# Patient Record
Sex: Female | Born: 1990 | Hispanic: Yes | Marital: Married | Smoking: Never smoker
Health system: Southern US, Community
[De-identification: ages and names within clinical notes are randomized; demographics above are authoritative.]

## PROBLEM LIST (undated history)

## (undated) DIAGNOSIS — G8929 Other chronic pain: Secondary | ICD-10-CM

## (undated) DIAGNOSIS — O139 Gestational [pregnancy-induced] hypertension without significant proteinuria, unspecified trimester: Secondary | ICD-10-CM

## (undated) DIAGNOSIS — R102 Pelvic and perineal pain: Secondary | ICD-10-CM

## (undated) HISTORY — DX: Gestational (pregnancy-induced) hypertension without significant proteinuria, unspecified trimester: O13.9

## (undated) HISTORY — PX: TUMOR REMOVAL: SHX12

---
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
## 2011-05-08 ENCOUNTER — Emergency Department (HOSPITAL_COMMUNITY)
Admission: EM | Admit: 2011-05-08 | Discharge: 2011-05-09 | Disposition: A | Discharge status: Other Acute Inpatient Hospital | Payer: Medicaid Other | Attending: Emergency Medicine | Admitting: Emergency Medicine

## 2011-05-08 ENCOUNTER — Emergency Department (HOSPITAL_COMMUNITY): Payer: Medicaid Other

## 2011-05-08 DIAGNOSIS — W108XXA Fall (on) (from) other stairs and steps, initial encounter: Secondary | ICD-10-CM | POA: Insufficient documentation

## 2011-05-08 DIAGNOSIS — O99891 Other specified diseases and conditions complicating pregnancy: Secondary | ICD-10-CM | POA: Insufficient documentation

## 2011-05-08 DIAGNOSIS — R071 Chest pain on breathing: Secondary | ICD-10-CM | POA: Insufficient documentation

## 2011-05-08 DIAGNOSIS — R10819 Abdominal tenderness, unspecified site: Secondary | ICD-10-CM | POA: Insufficient documentation

## 2011-05-08 DIAGNOSIS — N949 Unspecified condition associated with female genital organs and menstrual cycle: Secondary | ICD-10-CM | POA: Insufficient documentation

## 2011-05-08 DIAGNOSIS — R1012 Left upper quadrant pain: Secondary | ICD-10-CM | POA: Insufficient documentation

## 2011-05-09 ENCOUNTER — Inpatient Hospital Stay (HOSPITAL_COMMUNITY)
Admission: AD | Admit: 2011-05-09 | Discharge: 2011-05-09 | Disposition: A | Discharge status: Home / Self Care | Payer: Medicaid Other | Source: Other Acute Inpatient Hospital | Attending: Obstetrics and Gynecology | Admitting: Obstetrics and Gynecology

## 2011-05-09 DIAGNOSIS — O99891 Other specified diseases and conditions complicating pregnancy: Secondary | ICD-10-CM | POA: Insufficient documentation

## 2011-05-09 DIAGNOSIS — O9989 Other specified diseases and conditions complicating pregnancy, childbirth and the puerperium: Secondary | ICD-10-CM

## 2011-05-09 DIAGNOSIS — W108XXA Fall (on) (from) other stairs and steps, initial encounter: Secondary | ICD-10-CM | POA: Insufficient documentation

## 2011-05-09 LAB — TYPE AND SCREEN: Antibody Screen: NEGATIVE

## 2011-05-09 LAB — URINALYSIS, ROUTINE W REFLEX MICROSCOPIC
Nitrite: NEGATIVE
Protein, ur: NEGATIVE mg/dL
Urobilinogen, UA: 0.2 mg/dL (ref 0.0–1.0)

## 2011-05-09 LAB — CBC
HCT: 33.2 % — ABNORMAL LOW (ref 36.0–46.0)
Hemoglobin: 11.9 g/dL — ABNORMAL LOW (ref 12.0–15.0)
MCH: 32.8 pg (ref 26.0–34.0)
MCHC: 35.8 g/dL (ref 30.0–36.0)
MCV: 91.5 fL (ref 78.0–100.0)
RBC: 3.63 MIL/uL — ABNORMAL LOW (ref 3.87–5.11)
RDW: 12.8 % (ref 11.5–15.5)
WBC: 9.5 10*3/uL (ref 4.0–10.5)

## 2011-05-09 LAB — DIFFERENTIAL
Basophils Absolute: 0 10*3/uL (ref 0.0–0.1)
Basophils Relative: 0 % (ref 0–1)
Eosinophils Absolute: 0.1 10*3/uL (ref 0.0–0.7)
Lymphs Abs: 2.2 10*3/uL (ref 0.7–4.0)
Monocytes Absolute: 1 10*3/uL (ref 0.1–1.0)
Monocytes Relative: 10 % (ref 3–12)
Neutro Abs: 6.2 10*3/uL (ref 1.7–7.7)
Neutrophils Relative %: 66 % (ref 43–77)

## 2011-05-09 LAB — COMPREHENSIVE METABOLIC PANEL
ALT: 55 U/L — ABNORMAL HIGH (ref 0–35)
Albumin: 3.2 g/dL — ABNORMAL LOW (ref 3.5–5.2)
Alkaline Phosphatase: 64 U/L (ref 39–117)
BUN: 9 mg/dL (ref 6–23)
CO2: 26 mEq/L (ref 19–32)
Calcium: 9.7 mg/dL (ref 8.4–10.5)
Chloride: 103 mEq/L (ref 96–112)
Creatinine, Ser: <0.47 mg/dL — ABNORMAL LOW (ref 0.50–1.10)
Glucose, Bld: 82 mg/dL (ref 70–99)
Potassium: 3.7 mEq/L (ref 3.5–5.1)
Sodium: 137 mEq/L (ref 135–145)
Total Bilirubin: 0.2 mg/dL — ABNORMAL LOW (ref 0.3–1.2)

## 2011-05-09 LAB — URINE MICROSCOPIC-ADD ON

## 2011-05-09 LAB — ABO/RH: ABO/RH(D): O POS

## 2012-07-10 ENCOUNTER — Encounter (HOSPITAL_COMMUNITY): Payer: Self-pay

## 2012-07-10 ENCOUNTER — Emergency Department (HOSPITAL_COMMUNITY)
Admission: EM | Admit: 2012-07-10 | Discharge: 2012-07-10 | Disposition: A | Discharge status: Home / Self Care | Payer: Medicaid Other | Attending: Emergency Medicine | Admitting: Emergency Medicine

## 2012-07-10 DIAGNOSIS — N390 Urinary tract infection, site not specified: Secondary | ICD-10-CM | POA: Insufficient documentation

## 2012-07-10 LAB — COMPREHENSIVE METABOLIC PANEL
AST: 17 U/L (ref 0–37)
BUN: 14 mg/dL (ref 6–23)
CO2: 25 mEq/L (ref 19–32)
Calcium: 9.6 mg/dL (ref 8.4–10.5)
Chloride: 104 mEq/L (ref 96–112)
Creatinine, Ser: 0.53 mg/dL (ref 0.50–1.10)
GFR calc non Af Amer: >90 mL/min (ref >90)
Total Bilirubin: 0.9 mg/dL (ref 0.3–1.2)

## 2012-07-10 LAB — URINALYSIS, ROUTINE W REFLEX MICROSCOPIC
Glucose, UA: NEGATIVE mg/dL
Hgb urine dipstick: NEGATIVE
Ketones, ur: NEGATIVE mg/dL
Protein, ur: NEGATIVE mg/dL
pH: 5.5 (ref 5.0–8.0)

## 2012-07-10 LAB — CBC WITH DIFFERENTIAL/PLATELET
Basophils Absolute: 0 10*3/uL (ref 0.0–0.1)
Basophils Relative: 0 % (ref 0–1)
Eosinophils Relative: 1 % (ref 0–5)
HCT: 39.5 % (ref 36.0–46.0)
Hemoglobin: 13.4 g/dL (ref 12.0–15.0)
Lymphocytes Relative: 31 % (ref 12–46)
MCHC: 33.9 g/dL (ref 30.0–36.0)
MCV: 90.4 fL (ref 78.0–100.0)
Monocytes Absolute: 0.5 10*3/uL (ref 0.1–1.0)
Monocytes Relative: 7 % (ref 3–12)
RDW: 12.4 % (ref 11.5–15.5)

## 2012-07-10 LAB — URINE MICROSCOPIC-ADD ON

## 2012-07-10 LAB — POCT PREGNANCY, URINE: Preg Test, Ur: NEGATIVE

## 2012-07-10 MED ORDER — NITROFURANTOIN MONOHYD MACRO 100 MG PO CAPS: 100.0000 mg | ORAL_CAPSULE | Freq: Two times a day (BID) | ORAL | Status: AC

## 2012-07-10 NOTE — ED Notes (Signed)
Pt here with abdominal pain, started yesterday.

## 2012-07-10 NOTE — ED Provider Notes (Signed)
History     CSN: [id number]  Arrival date & time 07/10/12  1254   First MD Initiated Contact with Patient 07/10/12 1643      Chief Complaint  Patient presents with  . Abdominal Pain    (Consider location/radiation/quality/duration/timing/severity/associated sxs/prior treatment) HPI Comments: Patient presents with a chief complaint of dysuria and suprapubic abdominal pain.  She describes the pain as a pressure.  Pain does not radiate.  Symptoms have been present since yesterday.  Symptoms gradually worsening.  She denies any nausea or vomiting.  No flank pain.  No diarrhea.  No fever or chills.    Patient is a 21 y.o. female presenting with dysuria. The history is provided by the patient.  Dysuria  This is a new problem. The current episode started yesterday. The problem occurs every urination. The problem has been gradually worsening. The quality of the pain is described as burning. There has been no fever. She is sexually active. There is no history of pyelonephritis. Associated symptoms include frequency, hesitancy and urgency. Pertinent negatives include no chills, no nausea, no vomiting, no discharge, no hematuria, no possible pregnancy and no flank pain. She has tried nothing for the symptoms. Her past medical history does not include recurrent UTIs.    History reviewed. No pertinent past medical history.  No past surgical history on file.  No family history on file.  History  Substance Use Topics  . Smoking status: Never Smoker   . Smokeless tobacco: Not on file  . Alcohol Use: No    OB History    Grav Para Term Preterm Abortions TAB SAB Ect Mult Living                  Review of Systems  Constitutional: Negative for fever and chills.  Gastrointestinal: Positive for abdominal pain. Negative for nausea, vomiting, diarrhea and constipation.  Genitourinary: Positive for dysuria, hesitancy, urgency and frequency. Negative for hematuria, flank pain, decreased urine volume,  vaginal bleeding, vaginal discharge and difficulty urinating.    Allergies  Review of patient's allergies indicates no known allergies.  Home Medications  No current outpatient prescriptions on file.  BP 98/59  Pulse 88  Temp 98 F (36.7 C) (Oral)  Resp 18  Ht 5\' 2"  (1.575 m)  Wt 115 lb (52.164 kg)  BMI 21.03 kg/m2  SpO2 100%  Physical Exam  Nursing note and vitals reviewed. Constitutional: She appears well-developed and well-nourished. No distress.  HENT:  Head: Normocephalic and atraumatic.  Mouth/Throat: Oropharynx is clear and moist.  Cardiovascular: Normal rate, regular rhythm and normal heart sounds.   Pulmonary/Chest: Effort normal and breath sounds normal.  Abdominal: Soft. Normal appearance and bowel sounds are normal. She exhibits no mass. There is tenderness in the suprapubic area. There is no rigidity, no rebound, no guarding and no CVA tenderness.  Neurological: She is alert.  Skin: Skin is warm and dry. She is not diaphoretic.  Psychiatric: She has a normal mood and affect.    ED Course  Procedures (including critical care time)  Labs Reviewed  URINALYSIS, ROUTINE W REFLEX MICROSCOPIC - Abnormal; Notable for the following:    APPearance HAZY (*)     Bilirubin Urine SMALL (*)     Leukocytes, UA SMALL (*)     All other components within normal limits  URINE MICROSCOPIC-ADD ON - Abnormal; Notable for the following:    Squamous Epithelial / LPF MANY (*)     Bacteria, UA MANY (*)  All other components within normal limits  CBC WITH DIFFERENTIAL  COMPREHENSIVE METABOLIC PANEL  POCT PREGNANCY, URINE  URINE CULTURE   No results found.   No diagnosis found.    MDM  Patient presenting with dysuria and suprapubic abdominal pressure.  UA shows a UTI.  Urine pregnancy negative.  Remainder of the labs are unremarkable.  Patient is afebrile.  No CVA tenderness.  No nausea or vomiting.  Therefore, feel that patient can be discharged home with antibiotic  prescription.  Return precautions discussed with patient.        Pascal Lux Bradford, PA-C 07/10/12 1705

## 2012-07-10 NOTE — ED Notes (Signed)
Pt ambulated to bathroom and provided an urine specimen

## 2012-07-11 LAB — URINE CULTURE
Colony Count: NO GROWTH
Culture: NO GROWTH

## 2012-07-11 NOTE — ED Provider Notes (Signed)
Medical screening examination/treatment/procedure(s) were performed by non-physician practitioner and as supervising physician I was immediately available for consultation/collaboration.  Jones Skene, M.D.     Jones Skene, MD 07/11/12 1232

## 2014-09-18 ENCOUNTER — Other Ambulatory Visit (HOSPITAL_COMMUNITY): Payer: Self-pay | Admitting: Family Medicine

## 2014-09-18 DIAGNOSIS — N939 Abnormal uterine and vaginal bleeding, unspecified: Secondary | ICD-10-CM

## 2014-09-21 ENCOUNTER — Other Ambulatory Visit (HOSPITAL_COMMUNITY): Payer: Self-pay | Admitting: Family Medicine

## 2014-09-21 DIAGNOSIS — N939 Abnormal uterine and vaginal bleeding, unspecified: Secondary | ICD-10-CM

## 2014-09-23 ENCOUNTER — Ambulatory Visit (HOSPITAL_COMMUNITY)
Admission: RE | Admit: 2014-09-23 | Discharge: 2014-09-23 | Disposition: A | Discharge status: Home / Self Care | Payer: Self-pay | Source: Ambulatory Visit | Attending: Family Medicine | Admitting: Family Medicine

## 2014-09-23 ENCOUNTER — Ambulatory Visit (HOSPITAL_COMMUNITY): Payer: Medicaid Other

## 2014-09-23 DIAGNOSIS — R938 Abnormal findings on diagnostic imaging of other specified body structures: Secondary | ICD-10-CM | POA: Insufficient documentation

## 2014-09-23 DIAGNOSIS — R102 Pelvic and perineal pain: Secondary | ICD-10-CM | POA: Insufficient documentation

## 2014-09-23 DIAGNOSIS — N939 Abnormal uterine and vaginal bleeding, unspecified: Secondary | ICD-10-CM | POA: Insufficient documentation

## 2015-04-03 ENCOUNTER — Encounter (HOSPITAL_COMMUNITY): Payer: Self-pay | Admitting: Emergency Medicine

## 2015-04-03 ENCOUNTER — Emergency Department (HOSPITAL_COMMUNITY)
Admission: EM | Admit: 2015-04-03 | Discharge: 2015-04-04 | Disposition: A | Discharge status: Home / Self Care | Payer: Self-pay | Attending: Emergency Medicine | Admitting: Emergency Medicine

## 2015-04-03 DIAGNOSIS — R110 Nausea: Secondary | ICD-10-CM | POA: Insufficient documentation

## 2015-04-03 DIAGNOSIS — R509 Fever, unspecified: Secondary | ICD-10-CM | POA: Insufficient documentation

## 2015-04-03 DIAGNOSIS — M549 Dorsalgia, unspecified: Secondary | ICD-10-CM | POA: Insufficient documentation

## 2015-04-03 DIAGNOSIS — R102 Pelvic and perineal pain: Secondary | ICD-10-CM

## 2015-04-03 DIAGNOSIS — R350 Frequency of micturition: Secondary | ICD-10-CM | POA: Insufficient documentation

## 2015-04-03 DIAGNOSIS — R319 Hematuria, unspecified: Secondary | ICD-10-CM | POA: Insufficient documentation

## 2015-04-03 DIAGNOSIS — Z87442 Personal history of urinary calculi: Secondary | ICD-10-CM | POA: Insufficient documentation

## 2015-04-03 DIAGNOSIS — R1030 Lower abdominal pain, unspecified: Secondary | ICD-10-CM | POA: Insufficient documentation

## 2015-04-03 NOTE — ED Provider Notes (Addendum)
CSN: [id number]     Arrival date & time 04/03/15  2311 History  This chart was scribed for Derwood Kaplan, MD by Phillis Haggis, ED Scribe. This patient was seen in room A03C/A03C and patient care was started at 11:57 PM.   Chief Complaint  Patient presents with  . Vaginal Bleeding  . Abdominal Cramping   The history is provided by the patient. No language interpreter was used.  HPI Comments: Michele Bates is a 23 y.o. female who presents to the Emergency Department complaining of intermittent lower abdominal pain onset two months ago. She reports each episode prior to Monday once a week. She reports that she could not sleep due to pain on Monday and has been experiencing vaginal spotting. She states that it feels like a sharp, burning pulling pain with each episode lasting 10-15 seconds, with multiple episodes per day. She states that she has been very irregular in her period, but has never experienced spotting. She reports her last menstrual period was last week. She reports clear, egg white thick discharge. She reports associated hematuria, frequency and lower back pain that is worse on the right side. She reports associated fever and chills two weeks ago that lasted one day. She reports history of UTIs. She denies history of STDs. She reports one pregnancy without complications. She denies history of abdominal surgeries. She denies history of pelvic disorders.  History reviewed. No pertinent past medical history. History reviewed. No pertinent past surgical history. No family history on file. History  Substance Use Topics  . Smoking status: Never Smoker   . Smokeless tobacco: Not on file  . Alcohol Use: No   OB History    No data available     Review of Systems  Constitutional: Positive for fever and chills.  Gastrointestinal: Positive for nausea and abdominal pain.  Genitourinary: Positive for frequency, hematuria, vaginal bleeding and vaginal discharge.  Musculoskeletal:  Positive for back pain.  All other systems reviewed and are negative.  Allergies  Review of patient's allergies indicates no known allergies.  Home Medications   Prior to Admission medications   Medication Sig Start Date End Date Taking? Authorizing Provider  ibuprofen (ADVIL,MOTRIN) 600 MG tablet Take 1 tablet (600 mg total) by mouth every 6 (six) hours as needed. 04/04/15   Genesia Caslin, MD   BP 108/67 mmHg  Pulse 77  Temp(Src) 98.4 F (36.9 C) (Oral)  Resp 20  SpO2 99%  LMP 03/27/2015   Physical Exam  Constitutional: She is oriented to person, place, and time. She appears well-developed and well-nourished.  HENT:  Head: Normocephalic and atraumatic.  Eyes: Conjunctivae and EOM are normal. Pupils are equal, round, and reactive to light.  Neck: Normal range of motion. Neck supple.  Cardiovascular: Normal rate, regular rhythm, normal heart sounds and intact distal pulses.   No murmur heard. Pulmonary/Chest: Effort normal and breath sounds normal. No respiratory distress. She has no wheezes.  Abdominal: Soft. Bowel sounds are normal. She exhibits no distension. There is tenderness in the right lower quadrant and suprapubic area. There is no rebound and no guarding.  Genitourinary: Vagina normal and uterus normal.  External exam - normal, no lesions Speculum exam: Pt has some white discharge, no blood Bimanual exam: Patient has no CMT, no adnexal tenderness or fullness and cervical os is closed  Musculoskeletal: Normal range of motion.  Right flank tenderness  Neurological: She is alert and oriented to person, place, and time.  Skin: Skin is warm and dry.  Psychiatric: She has a normal mood and affect. Her behavior is normal.  Nursing note and vitals reviewed.   ED Course  Procedures (including critical care time) DIAGNOSTIC STUDIES: Oxygen Saturation is 99% on room air, normal by my interpretation.    COORDINATION OF CARE: 12:05 AM-Discussed treatment plan which  includes labs and pelvic exam with pt at bedside and pt agreed to plan.   Labs Review Labs Reviewed  CBC WITH DIFFERENTIAL/PLATELET - Abnormal; Notable for the following:    Lymphocytes Relative 47 (*)    All other components within normal limits  LIPASE, BLOOD - Abnormal; Notable for the following:    Lipase 16 (*)    All other components within normal limits  URINALYSIS, ROUTINE W REFLEX MICROSCOPIC (NOT AT [facility]) - Abnormal; Notable for the following:    Hgb urine dipstick SMALL (*)    All other components within normal limits  WET PREP, GENITAL  COMPREHENSIVE METABOLIC PANEL  PREGNANCY, URINE  URINE MICROSCOPIC-ADD ON  POC URINE PREG, ED  GC/CHLAMYDIA PROBE AMP ([place]) NOT AT [facility]   Imaging Review US Transvaginal Non-ob  04/04/2015   CLINICAL DATA:  Chronic suprapubic abdominal pain and hematuria. Back pain. Vaginal bleeding. Initial encounter.  EXAM: TRANSABDOMINAL AND TRANSVAGINAL ULTRASOUND OF PELVIS  TECHNIQUE: Both transabdominal and transvaginal ultrasound examinations of the pelvis were performed. Transabdominal technique was performed for global imaging of the pelvis including uterus, ovaries, adnexal regions, and pelvic cul-de-sac. It was necessary to proceed with endovaginal exam following the transabdominal exam to visualize the endometrium.  COMPARISON:  None  FINDINGS: Uterus  Measurements: 8.2 x 3.4 x 4.3 cm. No fibroids or other mass visualized.  Endometrium  Thickness: 0.4 cm.  No focal abnormality visualized.  Right ovary  Measurements: 2.3 x 2.1 x 1.2 cm. Normal appearance/no adnexal mass.  Left ovary  Measurements: 2.7 x 1.7 x 2.1 cm. Normal appearance/no adnexal mass.  Other findings  No free fluid is seen within the pelvic cul-de-sac.  IMPRESSION: Unremarkable pelvic ultrasound.  No evidence for ovarian torsion.   Electronically Signed   By: Roanna Raider M.D.   On: 04/04/2015 02:04   US Pelvis Complete  04/04/2015   CLINICAL DATA:  Chronic suprapubic abdominal  pain and hematuria. Back pain. Vaginal bleeding. Initial encounter.  EXAM: TRANSABDOMINAL AND TRANSVAGINAL ULTRASOUND OF PELVIS  TECHNIQUE: Both transabdominal and transvaginal ultrasound examinations of the pelvis were performed. Transabdominal technique was performed for global imaging of the pelvis including uterus, ovaries, adnexal regions, and pelvic cul-de-sac. It was necessary to proceed with endovaginal exam following the transabdominal exam to visualize the endometrium.  COMPARISON:  None  FINDINGS: Uterus  Measurements: 8.2 x 3.4 x 4.3 cm. No fibroids or other mass visualized.  Endometrium  Thickness: 0.4 cm.  No focal abnormality visualized.  Right ovary  Measurements: 2.3 x 2.1 x 1.2 cm. Normal appearance/no adnexal mass.  Left ovary  Measurements: 2.7 x 1.7 x 2.1 cm. Normal appearance/no adnexal mass.  Other findings  No free fluid is seen within the pelvic cul-de-sac.  IMPRESSION: Unremarkable pelvic ultrasound.  No evidence for ovarian torsion.   Electronically Signed   By: Roanna Raider M.D.   On: 04/04/2015 02:04     EKG Interpretation None      MDM   Final diagnoses:  Suprapubic pain, acute    I personally performed the services described in this documentation, which was scribed in my presence. The recorded information has been reviewed and is accurate.  Pt comes  in with lower quadrant abd pain - suprapubic region. Intermittent initially, now constant. Pelvic exam unremarkable. No concerns for etiologies that cause acute abdomen, and no clinical concerns for infectious process. Will get US pelvis - if neg, will give her gyne f/u.    Derwood KaplanAnkit Rally Ouch, MD 04/04/15 [id number]  Derwood KaplanAnkit Helayna Dun, MD 04/04/15 [phone]

## 2015-04-03 NOTE — ED Notes (Signed)
Pt. reports vaginal spotting with nausea and low abdominal cramping onset yesterday , denies  fever or chills, pt. also added intermittent diarrhea this week .

## 2015-04-04 ENCOUNTER — Emergency Department (HOSPITAL_COMMUNITY): Payer: Self-pay

## 2015-04-04 LAB — URINALYSIS, ROUTINE W REFLEX MICROSCOPIC
Bilirubin Urine: NEGATIVE
Glucose, UA: NEGATIVE mg/dL
Ketones, ur: NEGATIVE mg/dL
Leukocytes, UA: NEGATIVE
NITRITE: NEGATIVE
PH: 6 (ref 5.0–8.0)
Protein, ur: NEGATIVE mg/dL
Specific Gravity, Urine: 1.026 (ref 1.005–1.030)
Urobilinogen, UA: 0.2 mg/dL (ref 0.0–1.0)

## 2015-04-04 LAB — CBC WITH DIFFERENTIAL/PLATELET
BASOS ABS: 0 10*3/uL (ref 0.0–0.1)
BASOS PCT: 0 % (ref 0–1)
Eosinophils Absolute: 0.2 10*3/uL (ref 0.0–0.7)
Eosinophils Relative: 3 % (ref 0–5)
HEMATOCRIT: 38.4 % (ref 36.0–46.0)
Hemoglobin: 12.9 g/dL (ref 12.0–15.0)
LYMPHS PCT: 47 % — AB (ref 12–46)
Lymphs Abs: 3.3 10*3/uL (ref 0.7–4.0)
MCH: 29.6 pg (ref 26.0–34.0)
MCHC: 33.6 g/dL (ref 30.0–36.0)
MCV: 88.1 fL (ref 78.0–100.0)
MONO ABS: 0.5 10*3/uL (ref 0.1–1.0)
MONOS PCT: 6 % (ref 3–12)
Neutro Abs: 3.2 10*3/uL (ref 1.7–7.7)
Neutrophils Relative %: 44 % (ref 43–77)
PLATELETS: 297 10*3/uL (ref 150–400)
RBC: 4.36 MIL/uL (ref 3.87–5.11)
RDW: 12.7 % (ref 11.5–15.5)
WBC: 7.2 10*3/uL (ref 4.0–10.5)

## 2015-04-04 LAB — URINE MICROSCOPIC-ADD ON

## 2015-04-04 LAB — COMPREHENSIVE METABOLIC PANEL
ALBUMIN: 4 g/dL (ref 3.5–5.0)
ALT: 30 U/L (ref 14–54)
AST: 26 U/L (ref 15–41)
Alkaline Phosphatase: 63 U/L (ref 38–126)
Anion gap: 7 (ref 5–15)
BUN: 13 mg/dL (ref 6–20)
CO2: 25 mmol/L (ref 22–32)
Calcium: 9.3 mg/dL (ref 8.9–10.3)
Chloride: 106 mmol/L (ref 101–111)
Creatinine, Ser: 0.72 mg/dL (ref 0.44–1.00)
GFR calc Af Amer: >60 mL/min (ref >60)
GLUCOSE: 99 mg/dL (ref 65–99)
POTASSIUM: 4.1 mmol/L (ref 3.5–5.1)
Sodium: 138 mmol/L (ref 135–145)
Total Bilirubin: 0.4 mg/dL (ref 0.3–1.2)
Total Protein: 7 g/dL (ref 6.5–8.1)

## 2015-04-04 LAB — WET PREP, GENITAL
CLUE CELLS WET PREP: NONE SEEN
Trich, Wet Prep: NONE SEEN
WBC WET PREP: NONE SEEN
Yeast Wet Prep HPF POC: NONE SEEN

## 2015-04-04 LAB — LIPASE, BLOOD: Lipase: 16 U/L — ABNORMAL LOW (ref 22–51)

## 2015-04-04 LAB — POC URINE PREG, ED: Preg Test, Ur: NEGATIVE

## 2015-04-04 LAB — PREGNANCY, URINE: Preg Test, Ur: NEGATIVE

## 2015-04-04 MED ORDER — IBUPROFEN 600 MG PO TABS: 600.0000 mg | ORAL_TABLET | Freq: Four times a day (QID) | ORAL | Status: DC | PRN

## 2015-04-04 NOTE — Discharge Instructions (Signed)
We saw you in the ER for THE LOWER ABDOMINAL PAIN All the results in the ER are normal, labs and imaging. We are not sure what is causing your symptoms. The workup in the ER is not complete, and is limited to screening for life threatening and emergent conditions only, so please see a primary care doctor for further evaluation as requested.  Pelvic Pain Female pelvic pain can be caused by many different things and start from a variety of places. Pelvic pain refers to pain that is located in the lower half of the abdomen and between your hips. The pain may occur over a short period of time (acute) or may be reoccurring (chronic). The cause of pelvic pain may be related to disorders affecting the female reproductive organs (gynecologic), but it may also be related to the bladder, kidney stones, an intestinal complication, or muscle or skeletal problems. Getting help right away for pelvic pain is important, especially if there has been severe, sharp, or a sudden onset of unusual pain. It is also important to get help right away because some types of pelvic pain can be life threatening.  CAUSES  Below are only some of the causes of pelvic pain. The causes of pelvic pain can be in one of several categories.   Gynecologic.  Pelvic inflammatory disease.  Sexually transmitted infection.  Ovarian cyst or a twisted ovarian ligament (ovarian torsion).  Uterine lining that grows outside the uterus (endometriosis).  Fibroids, cysts, or tumors.  Ovulation.  Pregnancy.  Pregnancy that occurs outside the uterus (ectopic pregnancy).  Miscarriage.  Labor.  Abruption of the placenta or ruptured uterus.  Infection.  Uterine infection (endometritis).  Bladder infection.  Diverticulitis.  Miscarriage related to a uterine infection (septic abortion).  Bladder.  Inflammation of the bladder (cystitis).  Kidney  stone(s).  Gastrointestinal.  Constipation.  Diverticulitis.  Neurologic.  Trauma.  Feeling pelvic pain because of mental or emotional causes (psychosomatic).  Cancers of the bowel or pelvis. EVALUATION  Your caregiver will want to take a careful history of your concerns. This includes recent changes in your health, a careful gynecologic history of your periods (menses), and a sexual history. Obtaining your family history and medical history is also important. Your caregiver may suggest a pelvic exam. A pelvic exam will help identify the location and severity of the pain. It also helps in the evaluation of which organ system may be involved. In order to identify the cause of the pelvic pain and be properly treated, your caregiver may order tests. These tests may include:   A pregnancy test.  Pelvic ultrasonography.  An X-ray exam of the abdomen.  A urinalysis or evaluation of vaginal discharge.  Blood tests. HOME CARE INSTRUCTIONS   Only take over-the-counter or prescription medicines for pain, discomfort, or fever as directed by your caregiver.   Rest as directed by your caregiver.   Eat a balanced diet.   Drink enough fluids to make your urine clear or pale yellow, or as directed.   Avoid sexual intercourse if it causes pain.   Apply warm or cold compresses to the lower abdomen depending on which one helps the pain.   Avoid stressful situations.   Keep a journal of your pelvic pain. Write down when it started, where the pain is located, and if there are things that seem to be associated with the pain, such as food or your menstrual cycle.  Follow up with your caregiver as directed.  SEEK MEDICAL CARE IF:  Your medicine does not help your pain.  You have abnormal vaginal discharge. SEEK IMMEDIATE MEDICAL CARE IF:   You have heavy bleeding from the vagina.   Your pelvic pain increases.   You feel light-headed or faint.   You have chills.   You  have pain with urination or blood in your urine.   You have uncontrolled diarrhea or vomiting.   You have a fever or persistent symptoms for more than 3 days.  You have a fever and your symptoms suddenly get worse.   You are being physically or sexually abused.  MAKE SURE YOU:  Understand these instructions.  Will watch your condition.  Will get help if you are not doing well or get worse. Document Released: 09/19/2004 Document Revised: 03/09/2014 Document Reviewed: 02/12/2012 [facility] Patient Information 2015 [place], [place]. This information is not intended to replace advice given to you by your health care provider. Make sure you discuss any questions you have with your health care provider.

## 2015-04-06 LAB — GC/CHLAMYDIA PROBE AMP (~~LOC~~) NOT AT ARMC
Chlamydia: NEGATIVE
Neisseria Gonorrhea: NEGATIVE

## 2015-06-08 ENCOUNTER — Encounter (HOSPITAL_COMMUNITY): Payer: Self-pay | Admitting: Cardiology

## 2015-06-08 ENCOUNTER — Emergency Department (HOSPITAL_COMMUNITY)
Admission: EM | Admit: 2015-06-08 | Discharge: 2015-06-08 | Disposition: A | Discharge status: Home / Self Care | Payer: Self-pay | Attending: Emergency Medicine | Admitting: Emergency Medicine

## 2015-06-08 ENCOUNTER — Emergency Department (HOSPITAL_COMMUNITY): Payer: Self-pay

## 2015-06-08 DIAGNOSIS — N939 Abnormal uterine and vaginal bleeding, unspecified: Secondary | ICD-10-CM

## 2015-06-08 DIAGNOSIS — Z79899 Other long term (current) drug therapy: Secondary | ICD-10-CM | POA: Insufficient documentation

## 2015-06-08 DIAGNOSIS — R102 Pelvic and perineal pain unspecified side: Secondary | ICD-10-CM

## 2015-06-08 DIAGNOSIS — R52 Pain, unspecified: Secondary | ICD-10-CM

## 2015-06-08 DIAGNOSIS — G8929 Other chronic pain: Secondary | ICD-10-CM | POA: Insufficient documentation

## 2015-06-08 DIAGNOSIS — Z3202 Encounter for pregnancy test, result negative: Secondary | ICD-10-CM | POA: Insufficient documentation

## 2015-06-08 HISTORY — DX: Other chronic pain: G89.29

## 2015-06-08 HISTORY — DX: Pelvic and perineal pain: R10.2

## 2015-06-08 LAB — POC URINE PREG, ED: Preg Test, Ur: NEGATIVE

## 2015-06-08 LAB — URINALYSIS, ROUTINE W REFLEX MICROSCOPIC
Bilirubin Urine: NEGATIVE
GLUCOSE, UA: NEGATIVE mg/dL
Ketones, ur: NEGATIVE mg/dL
LEUKOCYTES UA: NEGATIVE
NITRITE: NEGATIVE
Protein, ur: NEGATIVE mg/dL
Specific Gravity, Urine: 1.01 (ref 1.005–1.030)
Urobilinogen, UA: 0.2 mg/dL (ref 0.0–1.0)
pH: 6.5 (ref 5.0–8.0)

## 2015-06-08 LAB — WET PREP, GENITAL
CLUE CELLS WET PREP: NONE SEEN
TRICH WET PREP: NONE SEEN
YEAST WET PREP: NONE SEEN

## 2015-06-08 LAB — URINE MICROSCOPIC-ADD ON

## 2015-06-08 MED ORDER — NAPROXEN 250 MG PO TABS: 250.0000 mg | ORAL_TABLET | Freq: Two times a day (BID) | ORAL | Status: DC | PRN

## 2015-06-08 MED ORDER — KETOROLAC TROMETHAMINE 60 MG/2ML IM SOLN
60.0000 mg | Freq: Once | INTRAMUSCULAR | Status: AC
  Administered 2015-06-08: 60 mg via INTRAMUSCULAR
  Filled 2015-06-08: qty 2

## 2015-06-08 NOTE — ED Notes (Signed)
Lower abdominal pain since yesterday.  Pain is worse today.

## 2015-06-08 NOTE — ED Notes (Signed)
Patient with no complaints at this time. Respirations even and unlabored. Skin warm/dry. Discharge instructions reviewed with patient at this time. Patient given opportunity to voice concerns/ask questions. Patient discharged at this time and left Emergency Department with steady gait.   

## 2015-06-08 NOTE — ED Provider Notes (Signed)
CSN: [id number]     Arrival date & time 06/08/15  1301 History   First MD Initiated Contact with Patient 06/08/15 1313     Chief Complaint  Patient presents with  . Pelvic Pain      HPI Pt was seen at 1325.  Per pt, c/o gradual onset and persistence of constant acute flair of her chronic suprapubic pelvic "pain" since yesterday.  Has been associated with nausea and vaginal bleeding.  Describes the abd pain as "sharp" and "cramping." States she "missed her period" last month. Pt has not taken any medication to treat her symptoms. Denies vomiting/diarrhea, no dysuria, no fevers, no back pain, no rash, no CP/SOB, no black or blood in stools. The symptoms have been associated with no other complaints. The patient has a significant history of similar symptoms previously, recently being evaluated for this complaint and multiple prior evals for same. Pt has not f/u with OB/GYN MD.     Past Medical History  Diagnosis Date  . Chronic pelvic pain in female    History reviewed. No pertinent past surgical history.  History  Substance Use Topics  . Smoking status: Never Smoker   . Smokeless tobacco: Not on file  . Alcohol Use: No    Review of Systems ROS: Statement: All systems negative except as marked or noted in the HPI; Constitutional: Negative for fever and chills. ; ; Eyes: Negative for eye pain, redness and discharge. ; ; ENMT: Negative for ear pain, hoarseness, nasal congestion, sinus pressure and sore throat. ; ; Cardiovascular: Negative for chest pain, palpitations, diaphoresis, dyspnea and peripheral edema. ; ; Respiratory: Negative for cough, wheezing and stridor. ; ; Gastrointestinal: +nausea. Negative for vomiting, diarrhea, abdominal pain, blood in stool, hematemesis, jaundice and rectal bleeding. . ; ; Genitourinary: Negative for dysuria, flank pain and hematuria. ; ; GYN:  +pelvic pain, +vaginal bleeding, no vaginal discharge, no vulvar pain. ;; Musculoskeletal: Negative for back pain  and neck pain. Negative for swelling and trauma.; ; Skin: Negative for pruritus, rash, abrasions, blisters, bruising and skin lesion.; ; Neuro: Negative for headache, lightheadedness and neck stiffness. Negative for weakness, altered level of consciousness , altered mental status, extremity weakness, paresthesias, involuntary movement, seizure and syncope.      Allergies  Review of patient's allergies indicates no known allergies.  Home Medications   Prior to Admission medications   Medication Sig Start Date End Date Taking? Authorizing Provider  OVER THE COUNTER MEDICATION Take 1 packet by mouth daily. Over the counter medication for fertility. (premama)   Yes Historical Provider, MD  ibuprofen (ADVIL,MOTRIN) 600 MG tablet Take 1 tablet (600 mg total) by mouth every 6 (six) hours as needed. Patient not taking: Reported on 06/08/2015 04/04/15   Derwood Kaplan, MD   BP 119/72 mmHg  Pulse 84  Temp(Src) 98.7 F (37.1 C) (Oral)  Resp 20  Wt 120 lb (54.432 kg)  SpO2 100%  LMP 06/07/2015 Physical Exam  1330: Physical examination:  Nursing notes reviewed; Vital signs and O2 SAT reviewed;  Constitutional: Well developed, Well nourished, Well hydrated, In no acute distress; Head:  Normocephalic, atraumatic; Eyes: EOMI, PERRL, No scleral icterus; ENMT: Mouth and pharynx normal, Mucous membranes moist; Neck: Supple, Full range of motion, No lymphadenopathy; Cardiovascular: Regular rate and rhythm, No murmur, rub, or gallop; Respiratory: Breath sounds clear & equal bilaterally, No rales, rhonchi, wheezes.  Speaking full sentences with ease, Normal respiratory effort/excursion; Chest: Nontender, Movement normal; Abdomen: Soft, +suprapubic tenderness to palp. No rebound  or guarding. Nondistended, Normal bowel sounds; Genitourinary: No CVA tenderness. Pelvic exam performed with permission of pt and female ED RN assist during exam.  External genitalia w/o lesions. Vaginal vault without discharge, small amount  of dark blood.  Cervix w/o lesions, not friable, GC/chlam and wet prep obtained and sent to lab.  Bimanual exam w/o CMT or left adnexal tenderness; +uterine and right adnexal tenderness.;; Extremities: Pulses normal, No tenderness, No edema, No calf edema or asymmetry.; Neuro: AA&Ox3, Major CN grossly intact.  Speech clear. No gross focal motor or sensory deficits in extremities. Climbs on and off stretcher easily by herself. Gait steady.; Skin: Color normal, Warm, Dry.   ED Course  Procedures     EKG Interpretation None      MDM  MDM Reviewed: previous chart, nursing note and vitals Reviewed previous: ultrasound and labs Interpretation: labs and ultrasound      Results for orders placed or performed during the hospital encounter of 06/08/15  Wet prep, genital  Result Value Ref Range   Yeast Wet Prep HPF POC NONE SEEN NONE SEEN   Trich, Wet Prep NONE SEEN NONE SEEN   Clue Cells Wet Prep HPF POC NONE SEEN NONE SEEN   WBC, Wet Prep HPF POC FEW (A) NONE SEEN  Urinalysis, Routine w reflex microscopic (not at [facility])  Result Value Ref Range   Color, Urine STRAW (A) YELLOW   APPearance CLOUDY (A) CLEAR   Specific Gravity, Urine 1.010 1.005 - 1.030   pH 6.5 5.0 - 8.0   Glucose, UA NEGATIVE NEGATIVE mg/dL   Hgb urine dipstick LARGE (A) NEGATIVE   Bilirubin Urine NEGATIVE NEGATIVE   Ketones, ur NEGATIVE NEGATIVE mg/dL   Protein, ur NEGATIVE NEGATIVE mg/dL   Urobilinogen, UA 0.2 0.0 - 1.0 mg/dL   Nitrite NEGATIVE NEGATIVE   Leukocytes, UA NEGATIVE NEGATIVE  Urine microscopic-add on  Result Value Ref Range   Squamous Epithelial / LPF RARE RARE   WBC, UA 0-2 <3 WBC/hpf   RBC / HPF TOO NUMEROUS TO COUNT <3 RBC/hpf  POC urine preg, ED (not at [facility])  Result Value Ref Range   Preg Test, Ur NEGATIVE NEGATIVE   US Transvaginal Non-ob 06/08/2015   CLINICAL DATA:  Right-sided pelvic pain and dysmenorrhea  EXAM: TRANSABDOMINAL AND TRANSVAGINAL ULTRASOUND OF PELVIS  DOPPLER ULTRASOUND OF  OVARIES  TECHNIQUE: Study was performed transabdominally to optimize pelvic field of view evaluation and transvaginally to optimize internal visceral architecture evaluation.  Color and duplex Doppler ultrasound was utilized to evaluate blood flow to the ovaries.  COMPARISON:  Apr 04, 2015  FINDINGS: Uterus  Measurements: 9.6 x 4.1 x 5.2 cm. No fibroids or other mass visualized.  Endometrium  Thickness: 14 mm.  No focal abnormality visualized.  Right ovary  Measurements: 2.9 x 1.7 x 2.1 cm. Normal appearance/no adnexal mass.  Left ovary  Measurements: 2.8 x 1.2 x 1.9 cm. Normal appearance/no adnexal mass.  Pulsed Doppler evaluation of both ovaries demonstrates normal low-resistance arterial and venous waveforms. The peak systolic velocity in the right ovary is 9 cm/sec. The peak systolic velocity in the left ovary is 11 cm/sec.  Other findings  No free fluid.  IMPRESSION: No abnormality noted. No intrauterine or extrauterine pelvic mass. No evidence of torsion. No free fluid.   Electronically Signed   By: Bretta Bang III M.D.   On: 06/08/2015 15:28   Ct Renal Stone Study 06/08/2015   CLINICAL DATA:  Lower abdominal pain starting yesterday  EXAM: CT  ABDOMEN AND PELVIS WITHOUT CONTRAST  TECHNIQUE: Multidetector CT imaging of the abdomen and pelvis was performed following the standard protocol without IV contrast.  COMPARISON:  Pelvic ultrasound examination today  FINDINGS: The lung bases are unremarkable. Sagittal images of the spine are unremarkable. Unenhanced liver, pancreas, spleen and adrenal glands are unremarkable. Gallbladder is contracted without evidence of calcified gallstones. Unenhanced kidneys are symmetrical in size. No nephrolithiasis. No hydronephrosis or hydroureter. No calcified ureteral calculi are noted.  No small bowel obstruction. No ascites or free air. No adenopathy. There is no pericecal inflammation. Normal appendix partially visualized in coronal image 29. The terminal ileum is  unremarkable. The uterus is anteflexed. No adnexal mass. The urinary bladder is unremarkable. Bilateral distal ureter is unremarkable. No calcified calculi are noted within urinary bladder. No pelvic ascites or adenopathy. Small nonspecific bilateral inguinal lymph nodes are noted.  IMPRESSION: 1. No nephrolithiasis.  No hydronephrosis or hydroureter. 2. Normal appendix.  No pericecal inflammation. 3. No calcified ureteral calculi. 4. No adnexal mass.  Unremarkable urinary bladder.   Electronically Signed   By: Natasha Mead M.D.   On: 06/08/2015 16:00     1600:  Workup reassuring. Tx symptomatically at this time. Hx of chronic pelvic pain with multiple ED visits for same.  Pt encouraged to f/u with her OB/GYN MD for good continuity of care and control of her chronic pain.  Verb understanding.    Samuel Jester, DO 06/11/15 [phone]

## 2015-06-08 NOTE — Discharge Instructions (Signed)
°Emergency Department Resource Guide °1) Find a Doctor and Pay Out of Pocket °Although you won't have to find out who is covered by your insurance plan, it is a good idea to ask around and get recommendations. You will then need to call the office and see if the doctor you have chosen will accept you as a new patient and what types of options they offer for patients who are self-pay. Some doctors offer discounts or will set up payment plans for their patients who do not have insurance, but you will need to ask so you aren't surprised when you get to your appointment. ° °2) Contact Your Local Health Department °Not all health departments have doctors that can see patients for sick visits, but many do, so it is worth a call to see if yours does. If you don't know where your local health department is, you can check in your phone book. The CDC also has a tool to help you locate your state's health department, and many state websites also have listings of all of their local health departments. ° °3) Find a Walk-in Clinic °If your illness is not likely to be very severe or complicated, you may want to try a walk in clinic. These are popping up all over the country in pharmacies, drugstores, and shopping centers. They're usually staffed by nurse practitioners or physician assistants that have been trained to treat common illnesses and complaints. They're usually fairly quick and inexpensive. However, if you have serious medical issues or chronic medical problems, these are probably not your best option. ° °No Primary Care Doctor: °- Call [facility] at  832-8000 - they can help you locate a primary care doctor that  accepts your insurance, provides certain services, etc. °- Physician Referral Service- 1-800-533-3463 ° °Chronic Pain Problems: °Organization         Address  Phone   Notes  °[facility]  ([phone]) [phone] Patients need to be referred by their primary care doctor.  ° °Medication  Assistance: °Organization         Address  Phone   Notes  °[place] [facility] [address] °[place], [place] [address] (336) 641-8030 --Must be a resident of [place] °-- Must have NO insurance coverage whatsoever (no Medicaid/ Medicare, etc.) °-- The pt. MUST have a primary care doctor that directs their care regularly and follows them in the community °  °[facility]  (866) 331-1348   °[employer]  (888) 892-1162   ° °Agencies that provide inexpensive medical care: °Organization         Address  Phone   Notes  °[facility]  (336) 832-8035   °[facility]    (336) 832-7272   °[facility] [address] °[place], [place] [address] (336) 832-4777   °[facility] [address], °[place] (336) 271-4999   °[facility]    (336) 373-0678   °[facility]    (336) 272-1050   °[facility] ° [address], [place] Phone:  (336) 832-4444, Fax:  (336) 832-4440 Hours of Operation:  9 am - 6 pm, M-F.  Also accepts Medicaid/Medicare and self-pay.  °[facility] ° [address], [place] Phone: (336) [phone], Fax: (336) [phone]. Hours of Operation:  8:30 am - 5:30 pm, M-F.  Also accepts Medicaid and self-pay.  °[facility] [place] [address], [place] Phone: (336) 878-6027   °[facility] [address], [place], [place] (336)723-1848, Ext. 123 Mondays & Thursdays: 7-9 AM.  First 15 patients are seen on a first come, first serve basis. °  ° °Medicaid-accepting [place] Providers: ° °Organization         Address  Phone   Notes  °[facility] [address], [place] (336) 641-2100 Also accepts self-pay patients.  °[facility] [address], [place] ° (336) 856-9996   °[facility] [address], [place]  (336) 288-8857   °[facility] [address] [place] [address], [place] (336) 299-7000   °Veita Bland [address], [place]  ° (336) 373-1557 Only accepts [place] Access Medicaid patients after they have their name applied to their card.  ° °Self-Pay (no insurance) in [place]: ° °Organization         Address  Phone   Notes  °Sickle Cell Patients, [facility] [address], [place] (336) 832-1970   °[facility] [address], [place] (336) 832-4400   °[facility] ° [address], [place] (336) 992-4800   °[facility]/Dr. Osei-Bonsu ° [address] [place] [address], [place] or [address], [place] (336) [phone] Phone number for both [place] and [place] locations is the same.  °[facility] [address], [place] (336) 299-0000   °[facility] [address] [place] [address], [place] or [address] (336) 852-7530 °(336) 878-2260   °[facility] [address], [place] (336) 350-1642, phone; (336) 294-5005, fax Sees patients 1st and 3rd Saturday of every month.  Must not qualify for public or private insurance (i.e. Medicaid, Medicare, [place] Health Choice, Veterans' Benefits) • Household income should be no more than 200% of the poverty level •The clinic cannot treat you if you are pregnant or think you are pregnant • Sexually transmitted diseases are not treated at the clinic.  ° ° °Dental Care: °Organization         Address  Phone  Notes  °[place] [facility] [address], [place] (336) 641-6152 Accepts children up to age 21 who are enrolled in Medicaid or [place] Health Choice; pregnant women with a Medicaid card; and children who have applied for Medicaid or [place] Health Choice, but were declined, whose parents can pay a reduced fee at time of service.  °[place]  [facility] [place]  [address], [place] (336) 641-7733 Accepts children up to age 21 who are enrolled in Medicaid or [place] Health Choice; pregnant women with a Medicaid card; and children who have applied for Medicaid or [place] Health Choice, but were declined, whose parents can pay a reduced fee at time of service.  °[facility] ° [address], [place] (336) 641-4533 Patients are seen by appointment only. Walk-ins are not accepted. [facility] will see patients 18 years of age and older. °Monday - Tuesday (8am-5pm) °Most Wednesdays (8:30-5pm) °$30 per visit, cash only  °[facility] ° [address], [place] (336) [phone] Patients are seen by appointment only. Walk-ins are not accepted. [facility] will see patients 18 years of age and older. °One   Wednesday Evening (Monthly: Volunteer Based).  $30 per visit, cash only  °[facility]  (919) 537-3737 for adults; Children under age 4, call Graduate Pediatric Dentistry at ([phone]) [phone]. Children aged 4-14, please call ([phone]) [phone] to request a pediatric application. ° Dental services are provided in all areas of dental care including fillings, crowns and bridges, complete and partial dentures, implants, gum treatment, root canals, and extractions. Preventive care is also provided. Treatment is provided to both adults and children. °Patients are selected via a lottery and there is often a waiting list. °  °[facility] [address], °[place] ° (336) 763-8833 [email or web address] °  °[facility] [address], [place], [place] (336)723-1848, Ext. 123 Second and Fourth Thursday of each month, opens at 6:30 AM; Clinic ends at 9 AM.  Patients are seen on a first-come first-served basis, and a limited number are seen during each clinic.  ° °[facility] ° [address], [place], [place] (336) [phone]    Eligibility Requirements °You must have lived in [place], [place], or [place] counties for at least the last three months. °  You cannot be eligible for state or federal sponsored healthcare insurance, including Veterans Administration, Medicaid, or Medicare. °  You generally cannot be eligible for healthcare insurance through your employer.  °  How to apply: °Eligibility screenings are held every Tuesday and Wednesday afternoon from 1:00 pm until 4:00 pm. You do not need an appointment for the interview!  °[facility] [address], [place], [place] 336-631-2330   °[facility]  336-342-8273   °[facility]  336-703-3100   °[facility]  336-570-6415   ° °Behavioral Health Resources in the Community: °Intensive Outpatient Programs °Organization         Address  Phone  Notes  °[facility] [address], [place], [place] 336-878-6098   °[facility] [address], [place], [place] 336-832-9800   °[facility] [address], [place], [place] ° 336-882-2125   °[facility] [address],  °[place], [place] 1-800-853-5163 or 336-641-4981   °Substance Abuse Resources °Organization         Address  Phone  Notes  °[facility]  336-882-2125   °[facility]  336-784-9470   °[facility]  336-285-9073   °[facility]  336-845-3988   °Residential & Outpatient Substance Abuse Program  1-800-659-3381   °Psychological Services °Organization         Address  Phone  Notes  °[facility]  336- 832-9600   °[facility]  336- 378-7881   °[facility] [address], [place] 1-800-853-5163 or 336-641-4981   ° °Mobile Crisis Teams °Organization         Address  Phone  Notes  °[facility], Mobile Crisis Care Unit  1-877-626-1772   °Assertive °[facility] ° [address]  [place], [place] 336-834-9664   °Sharon DeEsch [address] °[place] 336-554-5454   ° °Self-Help/Support Groups °Organization         Address  Phone             Notes  °[facility]  - variety of support groups  336- 373-1402 Call for more information  °Narcotics Anonymous (NA), [facility] [address], °[place]  2 meetings at this location  ° °  Residential Treatment Programs Organization         Address  Phone  Notes  [facility] [address],    [place]  1-610-960-4540   [facility]  [address], [place] [address], [place], [place] 478-295-6213   [facility] [address] [place], [place] 086-578-4696 Admissions: 8am-3pm M-F  [facility] [address],    [place], [place] 295-284-1324   [facility] [address] [place], [place], [place] 401-027-2536   [facility] [address]  [place], [place] 644-034-7425   [facility] Intensive Outpatient [address], [place], [place] 956-387-5643   [facility] ([facility] [address] [place]  [place], [place] 3-295-188-4166 or 867 016 2047   [facility] [address], [place], [place] 323-557-3220 Accepts Medicaid  [facility] [address]  [place] 2-542-706-2376 Substance Abuse/Addiction Treatment   [facility] Organization         Address  Phone  Notes  [facility]  (503) 449-1366   Angie Fava, PhD [address] [place], [place]   678-875-7487 or 5511197290   [facility]   [address] [place], [place] 3033289686   [facility] [address], [place], [place] (650)755-9995 Insurance/Medicaid/sponsorship through [facility] [address]                                    [place], [place] [phone] Therapy/tele-psych/case    [facility] [address], [place] 319-528-3898    Dr. Lolly Mustache  (325)677-6242   [facility]  [employer] [facility] Dept. 1) [address], [place] 2) [address], [place] 3)  [address], [place] [phone] ([phone]  (251)[phone]   [facility] Child Abuse Hotline (937) 201-0734 or (223) 537-5167 (After Hours)      Take the prescription as directed.  Apply moist heat to the area(s) of discomfort, for 15 minutes at a time, several times per day for the next few days.  Do not fall asleep on a heating pack.  Call the OB/GYN doctor tomorrow to schedule a follow up appointment within the next week.  Return to the Emergency Department immediately if worsening.

## 2015-06-09 LAB — GC/CHLAMYDIA PROBE AMP (~~LOC~~) NOT AT ARMC
Chlamydia: NEGATIVE
NEISSERIA GONORRHEA: NEGATIVE

## 2015-06-14 ENCOUNTER — Encounter: Payer: Self-pay | Admitting: Obstetrics and Gynecology

## 2017-02-04 IMAGING — US US TRANSVAGINAL NON-OB
1 series · 14 of 25 positions shown · non-contrast
Comparison: April 04, 2015

CLINICAL DATA: Right-sided pelvic pain and dysmenorrhea

EXAM:
TRANSABDOMINAL AND TRANSVAGINAL ULTRASOUND OF PELVIS
DOPPLER ULTRASOUND OF OVARIES
TECHNIQUE: Study was performed transabdominally to optimize pelvic field of
view evaluation and transvaginally to optimize internal visceral
architecture evaluation.
Color and duplex Doppler ultrasound was utilized to evaluate blood
flow to the ovaries.

[Series 1: us transvaginal non-ob · 0.20mm/px · 14 of 154 slices shown]
[im 1/154]
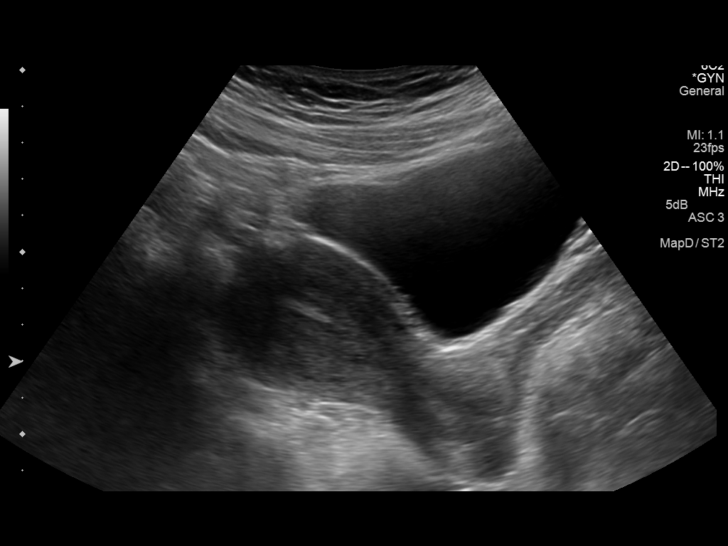
[im 13/154]
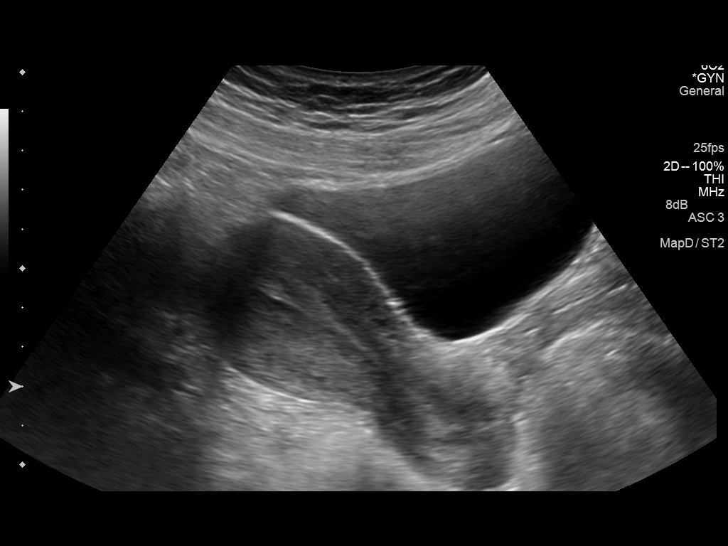
[im 26/154]
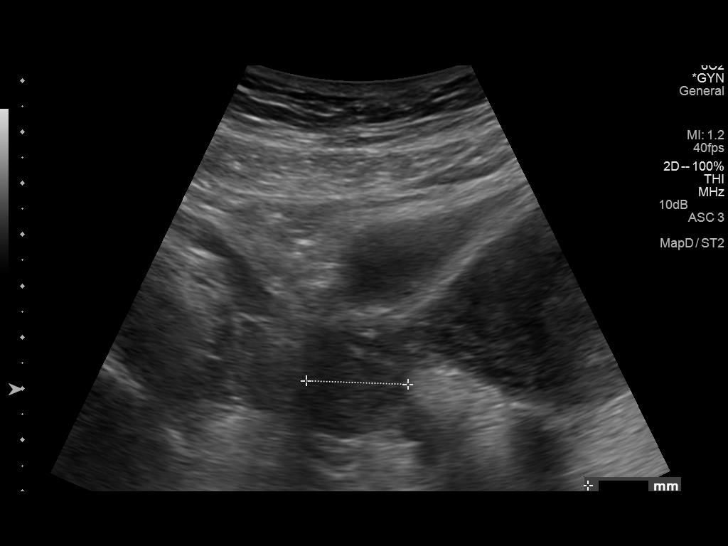
[im 39/154]
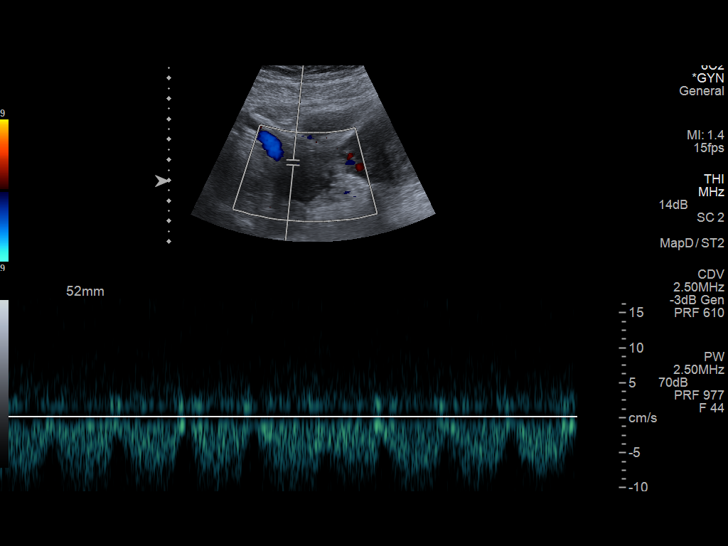
[im 52/154]
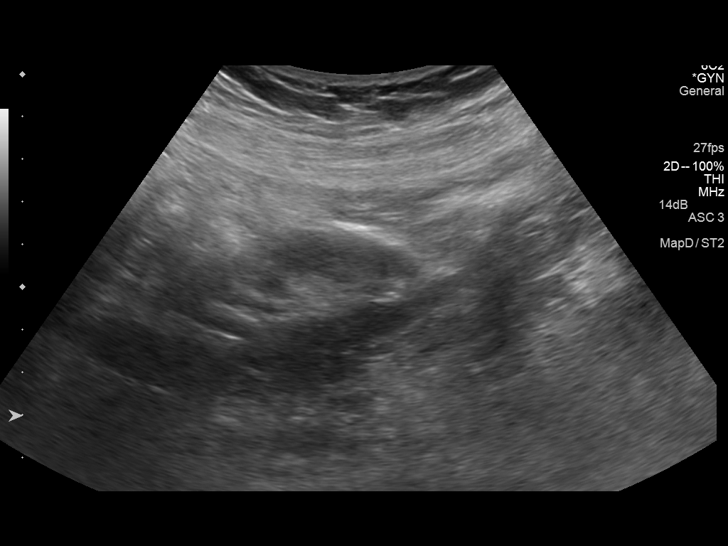
[im 58/154]
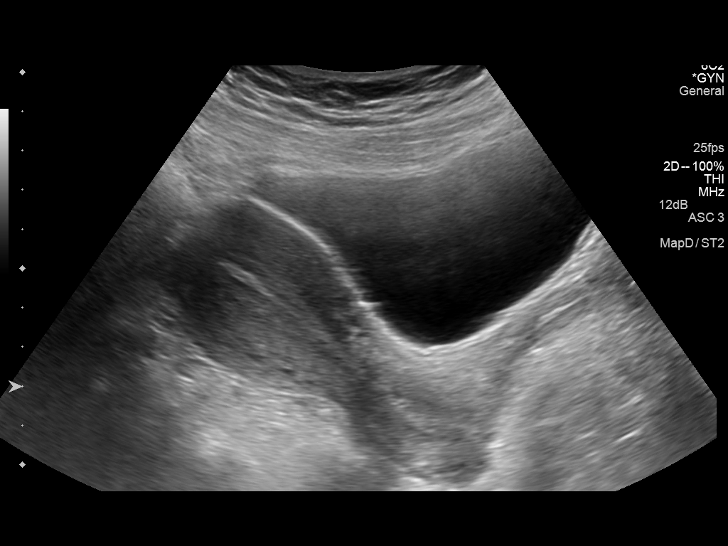
[im 71/154]
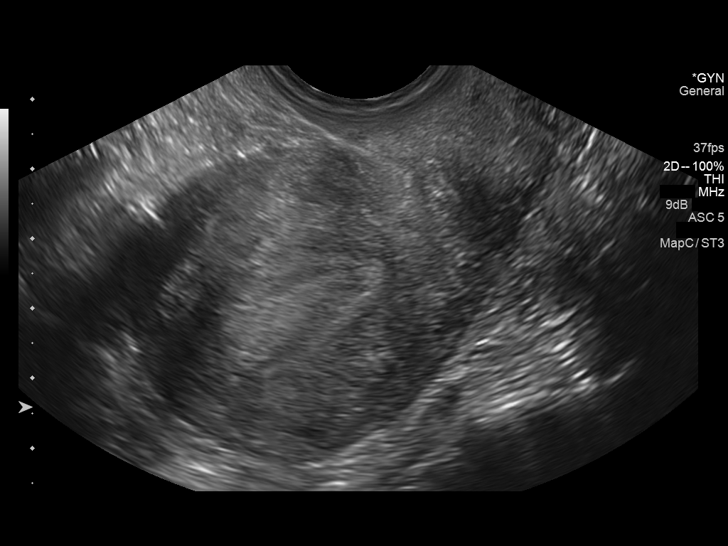
[im 83/154]
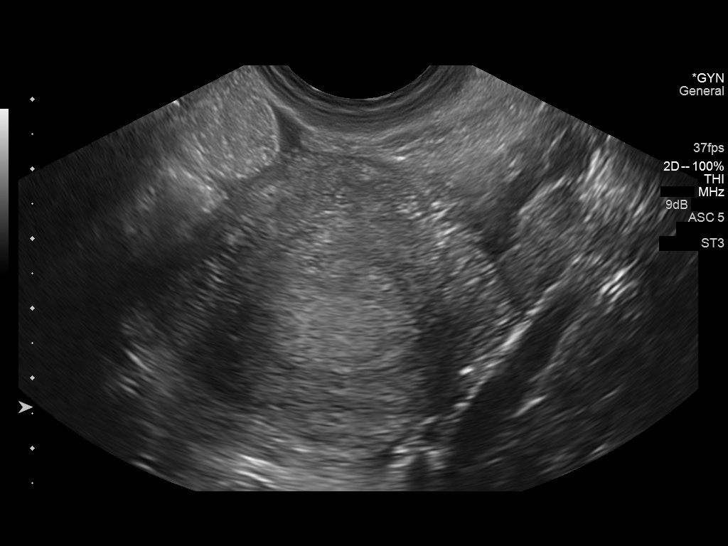
[im 96/154]
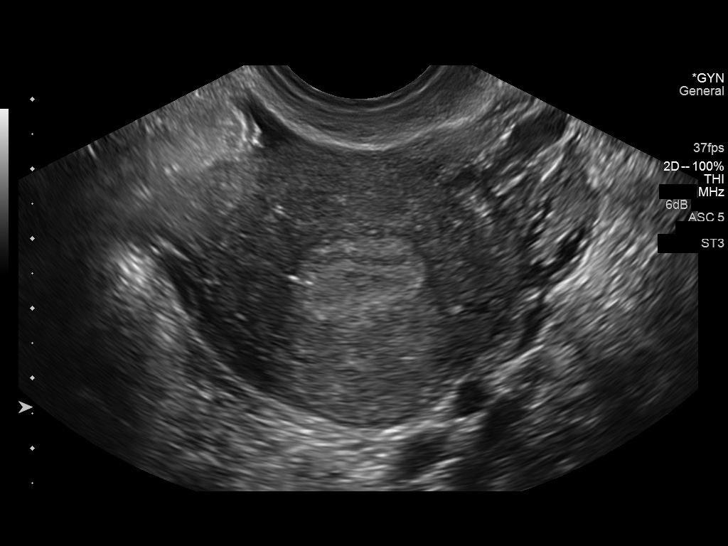
[im 103/154]
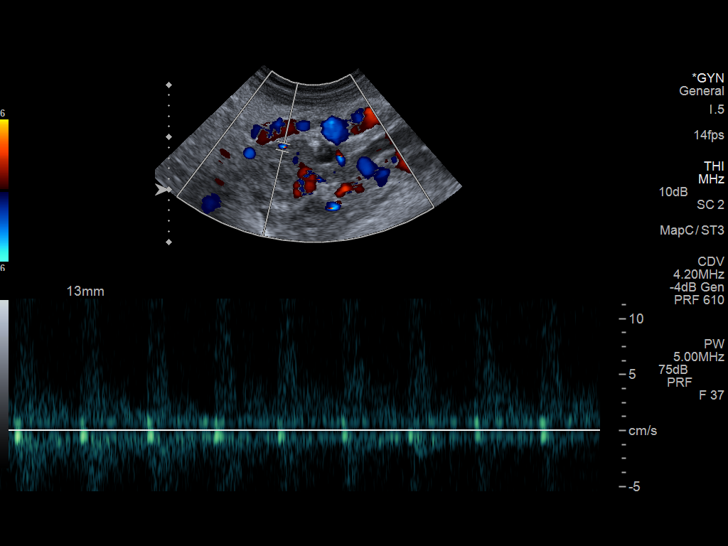
[im 115/154]
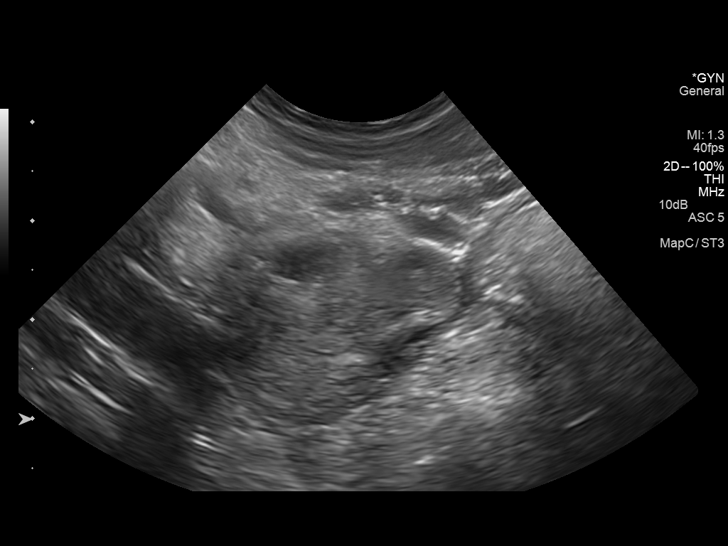
[im 128/154]
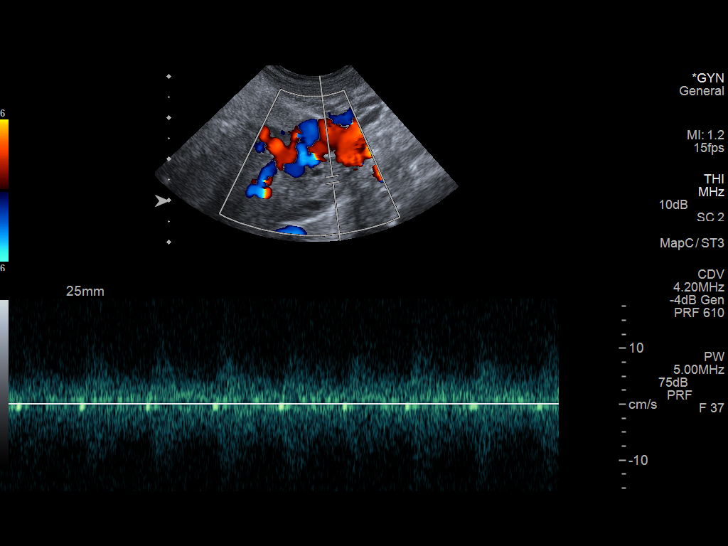
[im 141/154]
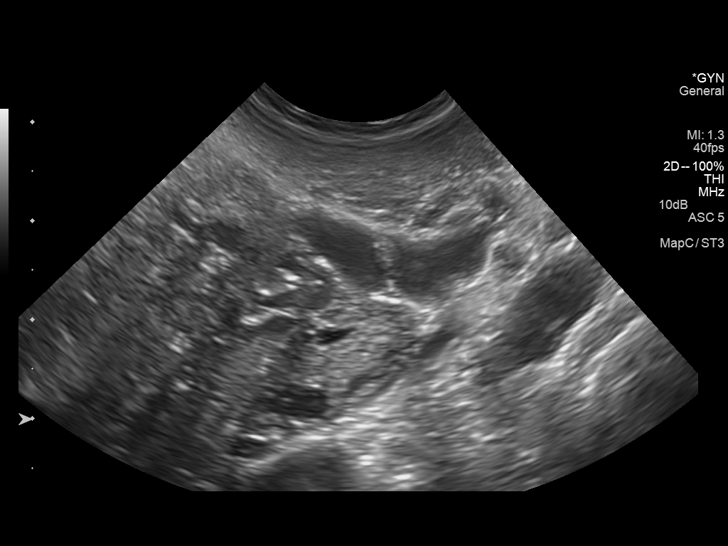
[im 154/154]
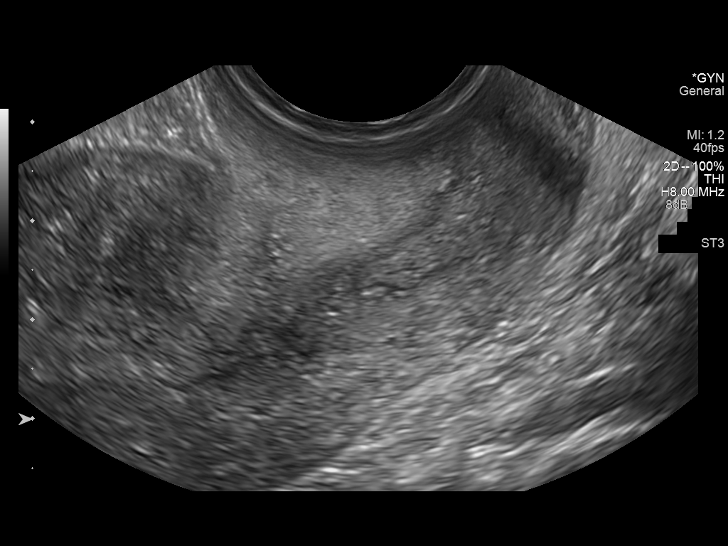

[14 of 25 positions shown; findings below may reference images not displayed]

FINDINGS: Uterus

Measurements: 9.6 x 4.1 x 5.2 cm. No fibroids or other mass
visualized.

Endometrium

Thickness: 14 mm.  No focal abnormality visualized.

Right ovary

Measurements: 2.9 x 1.7 x 2.1 cm. Normal appearance/no adnexal mass.

Left ovary

Measurements: 2.8 x 1.2 x 1.9 cm. Normal appearance/no adnexal mass.

Pulsed Doppler evaluation of both ovaries demonstrates normal
low-resistance arterial and venous waveforms. The peak systolic
velocity in the right ovary is 9 cm/sec. The peak systolic velocity
in the left ovary is 11 cm/sec.

Other findings

No free fluid.
IMPRESSION: No abnormality noted. No intrauterine or extrauterine pelvic mass.
No evidence of torsion. No free fluid.

## 2017-03-30 IMAGING — CT CT RENAL STONE PROTOCOL
1 series · 9 of 34 positions shown, 11 images · non-contrast
Comparison: Pelvic ultrasound examination today

CLINICAL DATA: Lower abdominal pain starting yesterday

EXAM:
CT ABDOMEN AND PELVIS WITHOUT CONTRAST
TECHNIQUE: Multidetector CT imaging of the abdomen and pelvis was performed
following the standard protocol without IV contrast.

[Series 5: mpr sagittal (id) · sagittal · 0.51mm/px · 9 of 105 slices shown, 11 images]
[im 4/105  lung]
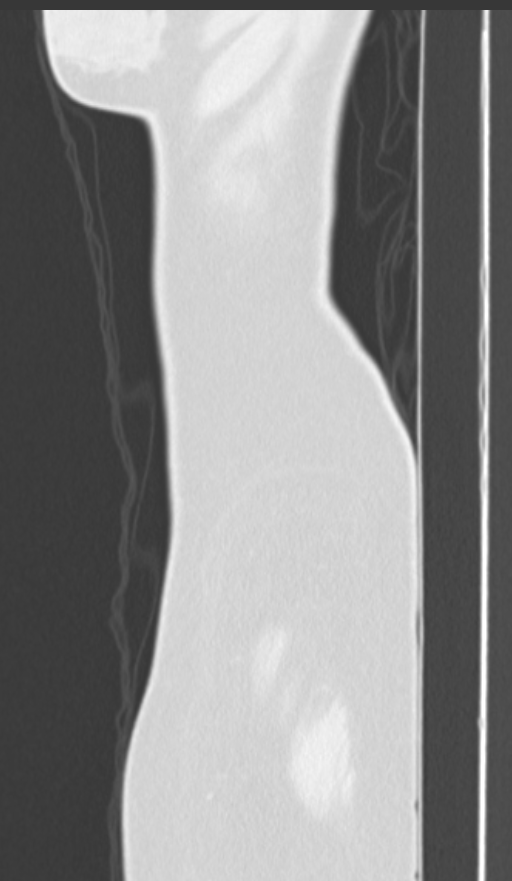
[im 7/105  lung]
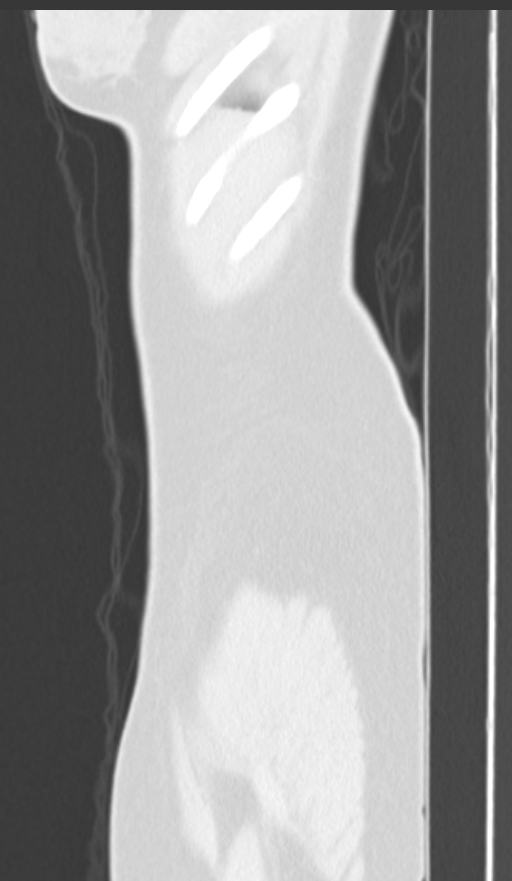
[im 11/105  soft-tissue]
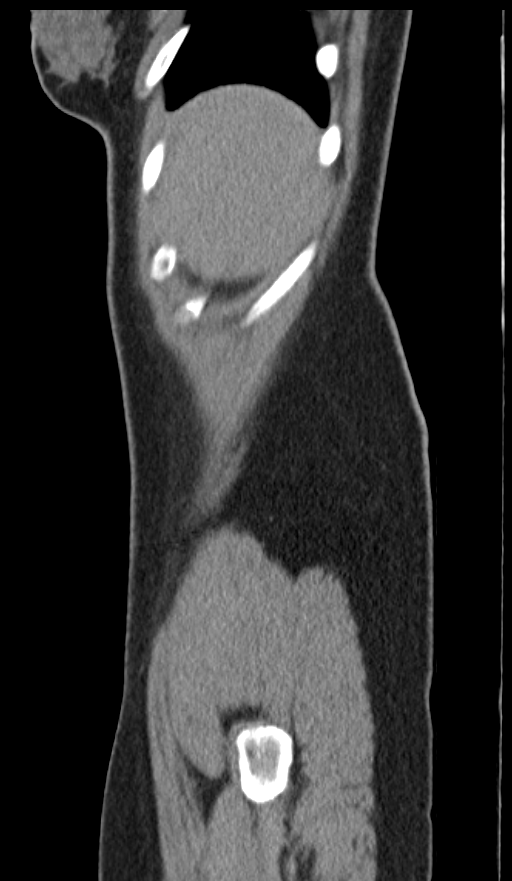
[im 11/105  lung]
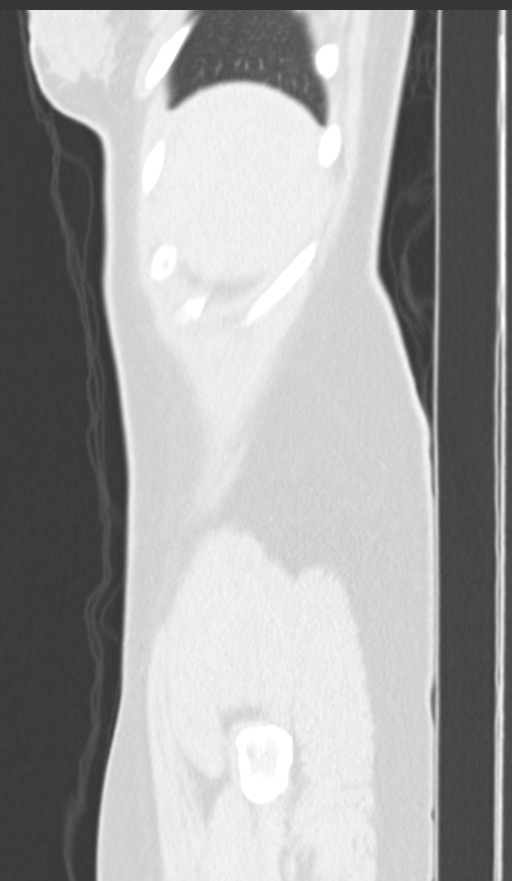
[im 11/105  bone]
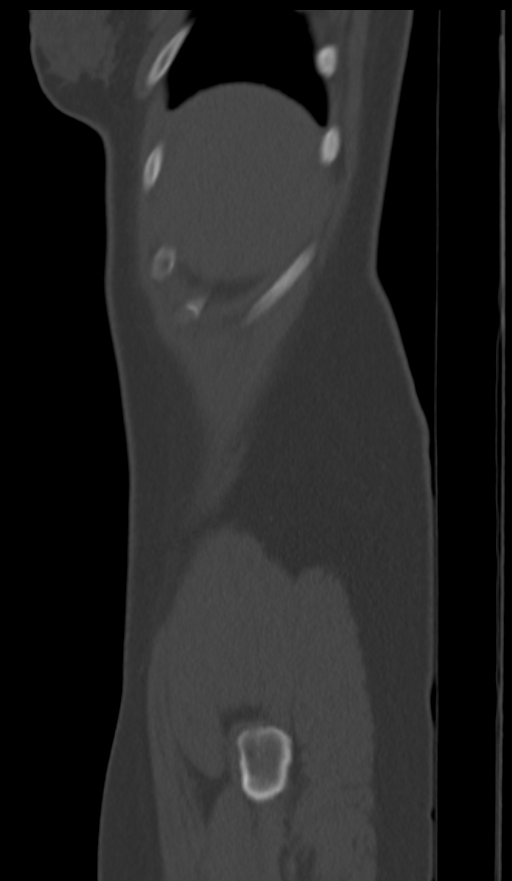
[im 14/105  lung]
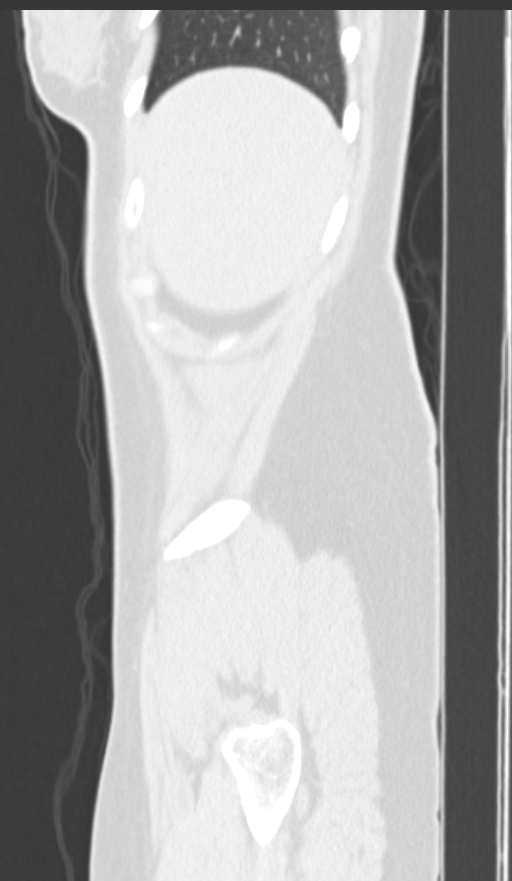
[im 31/105  soft-tissue]
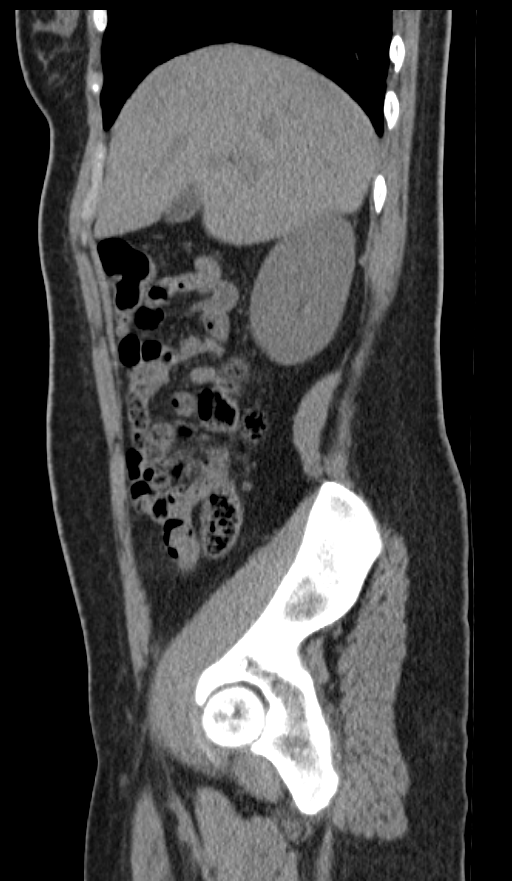
[im 44/105  soft-tissue]
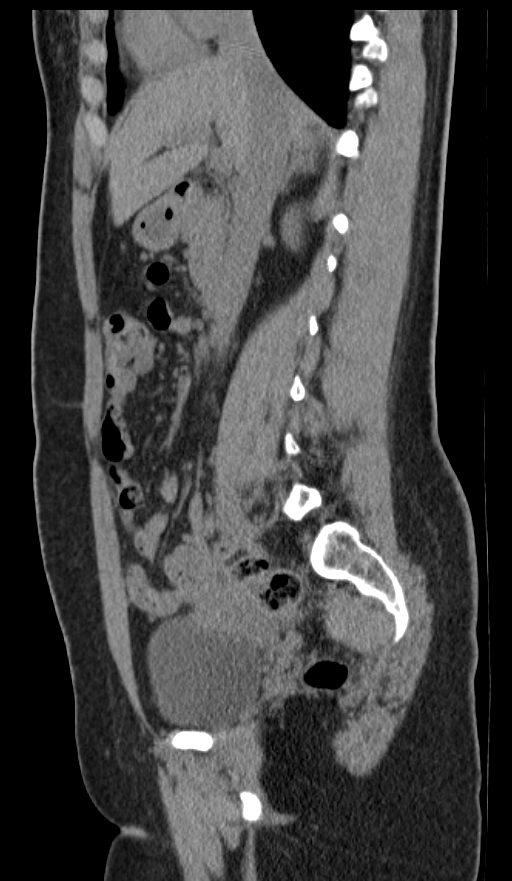
[im 61/105  soft-tissue]
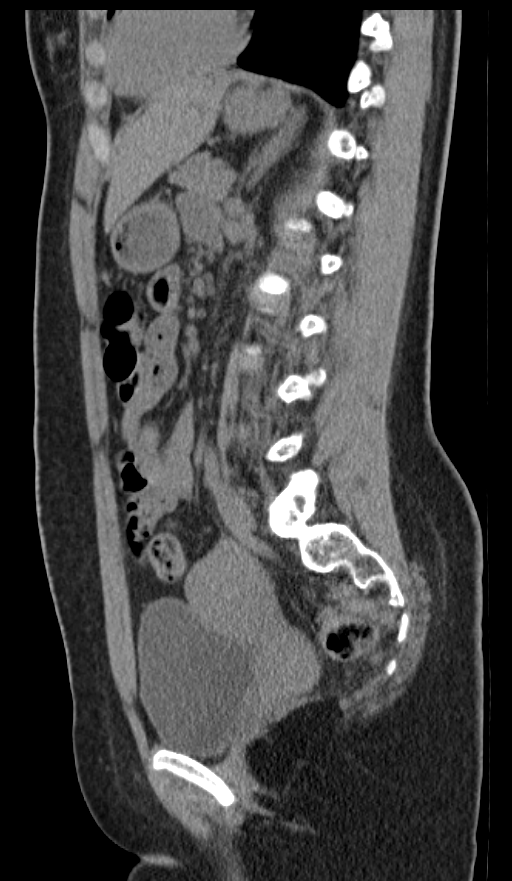
[im 78/105  soft-tissue]
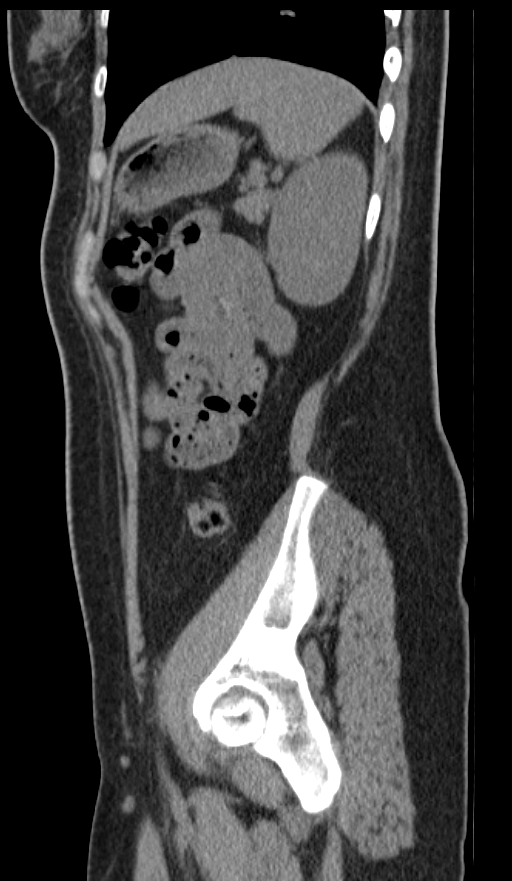
[im 94/105  soft-tissue]
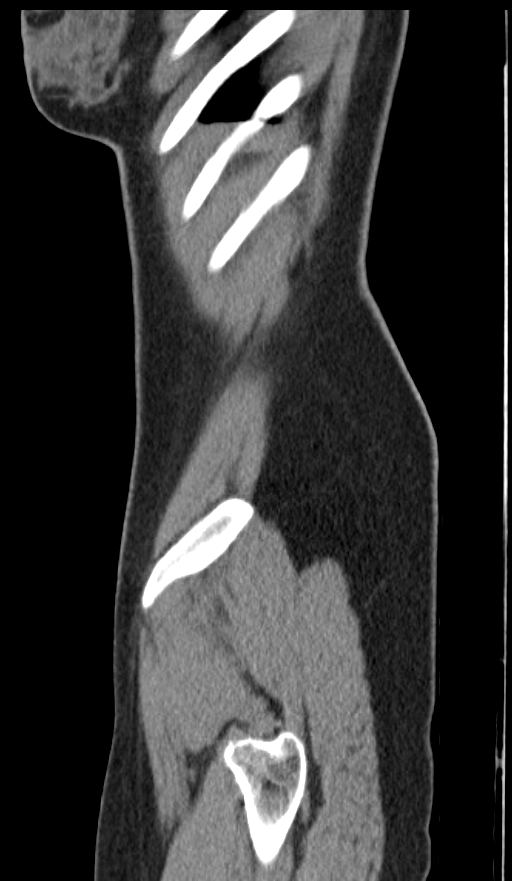

[9 of 34 positions shown; findings below may reference images not displayed]

FINDINGS: The lung bases are unremarkable. Sagittal images of the spine are
unremarkable. Unenhanced liver, pancreas, spleen and adrenal glands
are unremarkable. Gallbladder is contracted without evidence of
calcified gallstones. Unenhanced kidneys are symmetrical in size. No
nephrolithiasis. No hydronephrosis or hydroureter. No calcified
ureteral calculi are noted.

No small bowel obstruction. No ascites or free air. No adenopathy.
There is no pericecal inflammation. Normal appendix partially
visualized in coronal image 29. The terminal ileum is unremarkable.
The uterus is anteflexed. No adnexal mass. The urinary bladder is
unremarkable. Bilateral distal ureter is unremarkable. No calcified
calculi are noted within urinary bladder. No pelvic ascites or
adenopathy. Small nonspecific bilateral inguinal lymph nodes are
noted.
IMPRESSION: 1. No nephrolithiasis.  No hydronephrosis or hydroureter.
2. Normal appendix.  No pericecal inflammation.
3. No calcified ureteral calculi.
4. No adnexal mass.  Unremarkable urinary bladder.

## 2017-10-05 ENCOUNTER — Emergency Department (HOSPITAL_COMMUNITY)
Admission: EM | Admit: 2017-10-05 | Discharge: 2017-10-06 | Disposition: A | Discharge status: Home / Self Care | Payer: Medicaid Other | Attending: Emergency Medicine | Admitting: Emergency Medicine

## 2017-10-05 ENCOUNTER — Other Ambulatory Visit: Payer: Self-pay

## 2017-10-05 ENCOUNTER — Encounter (HOSPITAL_COMMUNITY): Payer: Self-pay

## 2017-10-05 DIAGNOSIS — Z3A01 Less than 8 weeks gestation of pregnancy: Secondary | ICD-10-CM | POA: Insufficient documentation

## 2017-10-05 DIAGNOSIS — Z3491 Encounter for supervision of normal pregnancy, unspecified, first trimester: Secondary | ICD-10-CM | POA: Insufficient documentation

## 2017-10-05 DIAGNOSIS — O4690 Antepartum hemorrhage, unspecified, unspecified trimester: Secondary | ICD-10-CM | POA: Diagnosis not present

## 2017-10-05 DIAGNOSIS — Z79899 Other long term (current) drug therapy: Secondary | ICD-10-CM | POA: Insufficient documentation

## 2017-10-05 LAB — CBC WITH DIFFERENTIAL/PLATELET
BASOS ABS: 0 10*3/uL (ref 0.0–0.1)
Basophils Relative: 0 %
EOS PCT: 2 %
Eosinophils Absolute: 0.2 10*3/uL (ref 0.0–0.7)
HEMATOCRIT: 40.1 % (ref 36.0–46.0)
HEMOGLOBIN: 13.3 g/dL (ref 12.0–15.0)
LYMPHS ABS: 2.2 10*3/uL (ref 0.7–4.0)
LYMPHS PCT: 23 %
MCH: 30.9 pg (ref 26.0–34.0)
MCHC: 33.2 g/dL (ref 30.0–36.0)
MCV: 93 fL (ref 78.0–100.0)
Monocytes Absolute: 0.7 10*3/uL (ref 0.1–1.0)
Monocytes Relative: 7 %
NEUTROS ABS: 6.6 10*3/uL (ref 1.7–7.7)
NEUTROS PCT: 68 %
PLATELETS: 255 10*3/uL (ref 150–400)
RBC: 4.31 MIL/uL (ref 3.87–5.11)
RDW: 12.3 % (ref 11.5–15.5)
WBC: 9.8 10*3/uL (ref 4.0–10.5)

## 2017-10-05 LAB — URINALYSIS, ROUTINE W REFLEX MICROSCOPIC
Bilirubin Urine: NEGATIVE
GLUCOSE, UA: NEGATIVE mg/dL
KETONES UR: NEGATIVE mg/dL
NITRITE: NEGATIVE
PROTEIN: NEGATIVE mg/dL
Specific Gravity, Urine: 1.008 (ref 1.005–1.030)
pH: 7 (ref 5.0–8.0)

## 2017-10-05 LAB — BASIC METABOLIC PANEL
Anion gap: 8 (ref 5–15)
BUN: 9 mg/dL (ref 6–20)
CO2: 26 mmol/L (ref 22–32)
CREATININE: 0.58 mg/dL (ref 0.44–1.00)
Calcium: 10.3 mg/dL (ref 8.9–10.3)
Chloride: 105 mmol/L (ref 101–111)
GFR calc Af Amer: >60 mL/min (ref >60)
GLUCOSE: 100 mg/dL — AB (ref 65–99)
Potassium: 3.5 mmol/L (ref 3.5–5.1)
Sodium: 139 mmol/L (ref 135–145)

## 2017-10-05 LAB — HCG, QUANTITATIVE, PREGNANCY: hCG, Beta Chain, Quant, S: 31923 m[IU]/mL — ABNORMAL HIGH (ref <5)

## 2017-10-05 LAB — ABO/RH: ABO/RH(D): O POS

## 2017-10-05 NOTE — ED Provider Notes (Signed)
[facility] EMERGENCY DEPARTMENT Provider Note   CSN: [id number] Arrival date & time: 10/05/17  1928     History   Chief Complaint Chief Complaint  Patient presents with  . Vaginal Bleeding    [redacted] weeks pregnant    HPI Michele Bates is a 26 y.o. female G2 P1 who is currently [redacted] weeks pregnant by LMP of 07/22/2017 who [facility] presenting with vaginal spotting which started earlier today.  She denies any passage of clots, states she essentially sees a trace of dark blood mostly on the toilet tissue with wiping, but noted the blood becoming a little more red at this evening so presents here for further evaluation.  Her bleeding is described as scant without need of pad or panty liner.  She denies abdominal or pelvic pain or other complaints.  She does endorse low back pain but this is a chronic condition that has been present for years and is not worsened with this pregnancy or today's symptoms.  She is scheduled for her first ultrasound in 2 weeks at her next prenatal visit with her OB/GYN.  At her last visit she said she had lots of lab tests done including a pelvic exam and screening for STDs which were negative.  HPI  Past Medical History:  Diagnosis Date  . Chronic pelvic pain in female     There are no active problems to display for this patient.   History reviewed. No pertinent surgical history.  OB History    Gravida Para Term Preterm AB Living   1             SAB TAB Ectopic Multiple Live Births                   Home Medications    Prior to Admission medications   Medication Sig Start Date End Date Taking? Authorizing Provider  calcium-vitamin D (OSCAL WITH D) 500-200 MG-UNIT tablet Take 1 tablet by mouth daily.   Yes [provider]  Prenatal Vit-Fe Fumarate-FA (PRENATAL MULTIVITAMIN) TABS tablet Take 1 tablet by mouth daily at 12 noon.   Yes [provider]    Family History No  family history on file.  Social History Social History   Tobacco Use  . Smoking status: Never Smoker  . Smokeless tobacco: Never Used  Substance Use Topics  . Alcohol use: No  . Drug use: No     Allergies   Patient has no known allergies.   Review of Systems Review of Systems  Constitutional: Negative for fatigue and fever.  HENT: Negative for congestion and sore throat.   Eyes: Negative.   Respiratory: Negative for chest tightness and shortness of breath.   Cardiovascular: Negative for chest pain.  Gastrointestinal: Negative for abdominal pain and nausea.  Genitourinary: Negative for dysuria, frequency, hematuria, pelvic pain, vaginal discharge and vaginal pain.       Negative except as mentioned in HPI  Musculoskeletal: Negative for arthralgias, joint swelling and neck pain.  Skin: Negative.  Negative for rash and wound.  Neurological: Negative for dizziness, weakness, light-headedness, numbness and headaches.  Psychiatric/Behavioral: Negative.      Physical Exam Updated Vital Signs BP 102/65   Pulse 80   Temp 98.7 F (37.1 C)   Resp 16   Ht 5\' 2"  (1.575 m)   Wt 66.2 kg (146 lb)   LMP 07/22/2017 (Approximate)   SpO2 100%   BMI 26.70 kg/m  Physical Exam  Constitutional: She appears well-developed and well-nourished.  HENT:  Head: Normocephalic and atraumatic.  Eyes: Conjunctivae are normal.  Neck: Normal range of motion.  Cardiovascular: Normal rate, regular rhythm, normal heart sounds and intact distal pulses.  Pulmonary/Chest: Effort normal and breath sounds normal. She has no wheezes.  Abdominal: Soft. Bowel sounds are normal. There is no tenderness.  Genitourinary:  Genitourinary Comments: Patient deferred exam  Musculoskeletal: Normal range of motion.  Neurological: She is alert.  Skin: Skin is warm and dry.  Psychiatric: She has a normal mood and affect.  Nursing note and vitals reviewed.    ED Treatments / Results  Labs (all labs ordered  are listed, but only abnormal results are displayed) Labs Reviewed  BASIC METABOLIC PANEL - Abnormal; Notable for the following components:      Result Value   Glucose, Bld 100 (*)    All other components within normal limits  HCG, QUANTITATIVE, PREGNANCY - Abnormal; Notable for the following components:   hCG, Beta Chain, Quant, S 31,923 (*)    All other components within normal limits  URINALYSIS, ROUTINE W REFLEX MICROSCOPIC - Abnormal; Notable for the following components:   APPearance CLOUDY (*)    Hgb urine dipstick LARGE (*)    Leukocytes, UA MODERATE (*)    Bacteria, UA RARE (*)    Squamous Epithelial / LPF 0-5 (*)    All other components within normal limits  URINE CULTURE  CBC WITH DIFFERENTIAL/PLATELET  ABO/RH    EKG  EKG Interpretation None       Radiology No results found.  Bedside ultrasound performed by Dr. Manus Gunningancour who is unable to confirm intrauterine pregnancy.  We will obtain a formal ultrasound to confirm that she did not have an ectopic.     Procedures Procedures (including critical care time)  Medications Ordered in ED Medications - No data to display   Initial Impression / Assessment and Plan / ED Course  I have reviewed the triage vital signs and the nursing notes.  Pertinent labs & imaging results that were available during my care of the patient were reviewed by me and considered in my medical decision making (see chart for details).     Patient with scant vaginal bleeding currently [redacted] weeks pregnant.  Discussed possibilities with patient including that this is potentially a normal early pregnancy finding.  However we cannot rule out the possibility of a early miscarriage.  Given that she has no abdominal or pelvic pain, I favor this being benign spotting.  Her lab tests are stable this evening.  Her Rh factor is positive, RhoGam is not indicated.  She does have some WBCs on her UA, rare bacteria and negative nitrites, urine culture was  ordered.  Currently pending ultrasound results.  Patient was discussed with Dr. Manus Gunningancour who will dispo patient once results are obtained  Final Clinical Impressions(s) / ED Diagnoses   Final diagnoses:  Vaginal bleeding in pregnancy    ED Discharge Orders    None       Victoriano Laindol, Shekira Drummer, PA-C 10/06/17 0214    Glynn Octaveancour, Stephen, MD 10/06/17 [phone]

## 2017-10-05 NOTE — ED Notes (Signed)
Pt notified that we need a urine sample, pt drinking water and will let us know when need to use the bathroom. States she had papsmear two weeks ago everything was normal and wondering if she has to still get a pelvic exam..

## 2017-10-05 NOTE — ED Triage Notes (Signed)
Pt goes to [facility] for her ob/gyn care and was told recently she was approx [redacted] weeks pregnant.  Pt states she started having some bright red vaginal bleeding that started today, no clots noted.  Pt having mild lower back pains

## 2017-10-06 ENCOUNTER — Emergency Department (HOSPITAL_COMMUNITY): Payer: Medicaid Other

## 2017-10-06 NOTE — Discharge Instructions (Signed)
As we discussed her pregnancy is very early.  This may or may not be a normal pregnancy.  Follow-up for repeat ultrasound and blood test in 10 days.  Return to the ED if you develop new or worsening symptoms.

## 2017-10-06 NOTE — ED Provider Notes (Signed)
Ultrasound shows intrauterine gestational sac without fetal pole or yolk sac.  Discussed with patient and this may be early pregnancy but needs further follow-up to assure viability.  Discussed with patient she needs to follow-up with OB.  She may be having a normal pregnancy or but she is still at risk for miscarriage.  Return precautions discussed including worsening abdominal pain, vaginal bleeding or any other concerns.  EMERGENCY DEPARTMENT US PREGNANCY "Study: Limited Ultrasound of the Pelvis for Pregnancy"  INDICATIONS:Pregnancy(required) and Vaginal bleeding Multiple views of the uterus and pelvic cavity were obtained in real-time with a multi-frequency probe.  APPROACH:Transabdominal  PERFORMED BY: Myself IMAGES ARCHIVED?: Yes LIMITATIONS: Body habitus and Decompressed bladder PREGNANCY FREE FLUID: None ADNEXAL FINDINGS:Left ovary not seen and Right ovary not seen GESTATIONAL AGE, ESTIMATE:  FETAL HEART RATE: NA INTERPRETATION: Intrauterine gestational sac noted       Glynn Octaveancour, Che Below, MD 10/06/17 0700

## 2017-10-07 LAB — URINE CULTURE
Culture: <10000 — AB
SPECIAL REQUESTS: NORMAL

## 2018-08-26 ENCOUNTER — Ambulatory Visit: Payer: Medicaid Other | Admitting: Adult Health

## 2018-08-26 ENCOUNTER — Other Ambulatory Visit: Payer: Self-pay

## 2018-08-26 ENCOUNTER — Encounter: Payer: Self-pay | Admitting: Adult Health

## 2018-08-26 VITALS — BP 127/86 | HR 96 | Ht 62.0 in | Wt 157.0 lb

## 2018-08-26 DIAGNOSIS — Z3201 Encounter for pregnancy test, result positive: Secondary | ICD-10-CM | POA: Diagnosis not present

## 2018-08-26 DIAGNOSIS — N926 Irregular menstruation, unspecified: Secondary | ICD-10-CM | POA: Diagnosis not present

## 2018-08-26 DIAGNOSIS — O3680X0 Pregnancy with inconclusive fetal viability, not applicable or unspecified: Secondary | ICD-10-CM

## 2018-08-26 DIAGNOSIS — R110 Nausea: Secondary | ICD-10-CM | POA: Diagnosis not present

## 2018-08-26 DIAGNOSIS — Z3A10 10 weeks gestation of pregnancy: Secondary | ICD-10-CM

## 2018-08-26 LAB — POCT URINE PREGNANCY: Preg Test, Ur: POSITIVE — AB

## 2018-08-26 NOTE — Patient Instructions (Signed)
First Trimester of Pregnancy The first trimester of pregnancy is from week 1 until the end of week 13 (months 1 through 3). A week after a sperm fertilizes an egg, the egg will implant on the wall of the uterus. This embryo will begin to develop into a baby. Genes from you and your partner will form the baby. The female genes will determine whether the baby will be a boy or a girl. At 6-8 weeks, the eyes and face will be formed, and the heartbeat can be seen on ultrasound. At the end of 12 weeks, all the baby's organs will be formed. Now that you are pregnant, you will want to do everything you can to have a healthy baby. Two of the most important things are to get good prenatal care and to follow your health care provider's instructions. Prenatal care is all the medical care you receive before the baby's birth. This care will help prevent, find, and treat any problems during the pregnancy and childbirth. Body changes during your first trimester Your body goes through many changes during pregnancy. The changes vary from woman to woman.  You may gain or lose a couple of pounds at first.  You may feel sick to your stomach (nauseous) and you may throw up (vomit). If the vomiting is uncontrollable, call your health care provider.  You may tire easily.  You may develop headaches that can be relieved by medicines. All medicines should be approved by your health care provider.  You may urinate more often. Painful urination may mean you have a bladder infection.  You may develop heartburn as a result of your pregnancy.  You may develop constipation because certain hormones are causing the muscles that push stool through your intestines to slow down.  You may develop hemorrhoids or swollen veins (varicose veins).  Your breasts may begin to grow larger and become tender. Your nipples may stick out more, and the tissue that surrounds them (areola) may become darker.  Your gums may bleed and may be  sensitive to brushing and flossing.  Dark spots or blotches (chloasma, mask of pregnancy) may develop on your face. This will likely fade after the baby is born.  Your menstrual periods will stop.  You may have a loss of appetite.  You may develop cravings for certain kinds of food.  You may have changes in your emotions from day to day, such as being excited to be pregnant or being concerned that something may go wrong with the pregnancy and baby.  You may have more vivid and strange dreams.  You may have changes in your hair. These can include thickening of your hair, rapid growth, and changes in texture. Some women also have hair loss during or after pregnancy, or hair that feels dry or thin. Your hair will most likely return to normal after your baby is born.  What to expect at prenatal visits During a routine prenatal visit:  You will be weighed to make sure you and the baby are growing normally.  Your blood pressure will be taken.  Your abdomen will be measured to track your baby's growth.  The fetal heartbeat will be listened to between weeks 10 and 14 of your pregnancy.  Test results from any previous visits will be discussed.  Your health care provider may ask you:  How you are feeling.  If you are feeling the baby move.  If you have had any abnormal symptoms, such as leaking fluid, bleeding, severe headaches,   or abdominal cramping.  If you are using any tobacco products, including cigarettes, chewing tobacco, and electronic cigarettes.  If you have any questions.  Other tests that may be performed during your first trimester include:  Blood tests to find your blood type and to check for the presence of any previous infections. The tests will also be used to check for low iron levels (anemia) and protein on red blood cells (Rh antibodies). Depending on your risk factors, or if you previously had diabetes during pregnancy, you may have tests to check for high blood  sugar that affects pregnant women (gestational diabetes).  Urine tests to check for infections, diabetes, or protein in the urine.  An ultrasound to confirm the proper growth and development of the baby.  Fetal screens for spinal cord problems (spina bifida) and Down syndrome.  HIV (human immunodeficiency virus) testing. Routine prenatal testing includes screening for HIV, unless you choose not to have this test.  You may need other tests to make sure you and the baby are doing well.  Follow these instructions at home: Medicines  Follow your health care provider's instructions regarding medicine use. Specific medicines may be either safe or unsafe to take during pregnancy.  Take a prenatal vitamin that contains at least 600 micrograms (mcg) of folic acid.  If you develop constipation, try taking a stool softener if your health care provider approves. Eating and drinking  Eat a balanced diet that includes fresh fruits and vegetables, whole grains, good sources of protein such as meat, eggs, or tofu, and low-fat dairy. Your health care provider will help you determine the amount of weight gain that is right for you.  Avoid raw meat and uncooked cheese. These carry germs that can cause birth defects in the baby.  Eating four or five small meals rather than three large meals a day may help relieve nausea and vomiting. If you start to feel nauseous, eating a few soda crackers can be helpful. Drinking liquids between meals, instead of during meals, also seems to help ease nausea and vomiting.  Limit foods that are high in fat and processed sugars, such as fried and sweet foods.  To prevent constipation: ? Eat foods that are high in fiber, such as fresh fruits and vegetables, whole grains, and beans. ? Drink enough fluid to keep your urine clear or pale yellow. Activity  Exercise only as directed by your health care provider. Most women can continue their usual exercise routine during  pregnancy. Try to exercise for 30 minutes at least 5 days a week. Exercising will help you: ? Control your weight. ? Stay in shape. ? Be prepared for labor and delivery.  Experiencing pain or cramping in the lower abdomen or lower back is a good sign that you should stop exercising. Check with your health care provider before continuing with normal exercises.  Try to avoid standing for long periods of time. Move your legs often if you must stand in one place for a long time.  Avoid heavy lifting.  Wear low-heeled shoes and practice good posture.  You may continue to have sex unless your health care provider tells you not to. Relieving pain and discomfort  Wear a good support bra to relieve breast tenderness.  Take warm sitz baths to soothe any pain or discomfort caused by hemorrhoids. Use hemorrhoid cream if your health care provider approves.  Rest with your legs elevated if you have leg cramps or low back pain.  If you develop   varicose veins in your legs, wear support hose. Elevate your feet for 15 minutes, 3-4 times a day. Limit salt in your diet. Prenatal care  Schedule your prenatal visits by the twelfth week of pregnancy. They are usually scheduled monthly at first, then more often in the last 2 months before delivery.  Write down your questions. Take them to your prenatal visits.  Keep all your prenatal visits as told by your health care provider. This is important. Safety  Wear your seat belt at all times when driving.  Make a list of emergency phone numbers, including numbers for family, friends, the hospital, and police and fire departments. General instructions  Ask your health care provider for a referral to a local prenatal education class. Begin classes no later than the beginning of month 6 of your pregnancy.  Ask for help if you have counseling or nutritional needs during pregnancy. Your health care provider can offer advice or refer you to specialists for help  with various needs.  Do not use hot tubs, steam rooms, or saunas.  Do not douche or use tampons or scented sanitary pads.  Do not cross your legs for long periods of time.  Avoid cat litter boxes and soil used by cats. These carry germs that can cause birth defects in the baby and possibly loss of the fetus by miscarriage or stillbirth.  Avoid all smoking, herbs, alcohol, and medicines not prescribed by your health care provider. Chemicals in these products affect the formation and growth of the baby.  Do not use any products that contain nicotine or tobacco, such as cigarettes and e-cigarettes. If you need help quitting, ask your health care provider. You may receive counseling support and other resources to help you quit.  Schedule a dentist appointment. At home, brush your teeth with a soft toothbrush and be gentle when you floss. Contact a health care provider if:  You have dizziness.  You have mild pelvic cramps, pelvic pressure, or nagging pain in the abdominal area.  You have persistent nausea, vomiting, or diarrhea.  You have a bad smelling vaginal discharge.  You have pain when you urinate.  You notice increased swelling in your face, hands, legs, or ankles.  You are exposed to fifth disease or chickenpox.  You are exposed to German measles (rubella) and have never had it. Get help right away if:  You have a fever.  You are leaking fluid from your vagina.  You have spotting or bleeding from your vagina.  You have severe abdominal cramping or pain.  You have rapid weight gain or loss.  You vomit blood or material that looks like coffee grounds.  You develop a severe headache.  You have shortness of breath.  You have any kind of trauma, such as from a fall or a car accident. Summary  The first trimester of pregnancy is from week 1 until the end of week 13 (months 1 through 3).  Your body goes through many changes during pregnancy. The changes vary from  woman to woman.  You will have routine prenatal visits. During those visits, your health care provider will examine you, discuss any test results you may have, and talk with you about how you are feeling. This information is not intended to replace advice given to you by your health care provider. Make sure you discuss any questions you have with your health care provider. Document Released: 10/17/2001 Document Revised: 10/04/2016 Document Reviewed: 10/04/2016 Elsevier Interactive Patient Education  2018 [employer]   Inc.  

## 2018-08-26 NOTE — Progress Notes (Signed)
  Subjective:     Patient ID: Michele Bates, female   DOB: November 20, 1990, 27 y.o.   MRN: [id number]  HPI Michele Bates is a 27 year old Hispanic female, married, in for UPT, she has had +HPT and some nausea.   Review of Systems +missed periods, with +HPT +nausea Reviewed past medical,surgical, social and family history. Reviewed medications and allergies.     Objective:   Physical Exam BP 127/86 (BP Location: Right Arm, Patient Position: Sitting, Cuff Size: Normal)   Pulse 96   Ht 5\' 2"  (1.575 m)   Wt 157 lb (71.2 kg)   LMP 06/14/2018   BMI 28.72 kg/m UPT +, about 10+3 weeks by LMP with EDD 03/21/19.Skin warm and dry. Neck: mid line trachea, normal thyroid, good ROM, no lymphadenopathy noted. Lungs: clear to ausculation bilaterally. Cardiovascular: regular rate and rhythm.Abdomen is soft and non tender. PHQ 2 score 0.    Assessment:     1. Positive pregnancy test   2. [redacted] weeks gestation of pregnancy   3. Encounter to determine fetal viability of pregnancy, single or unspecified fetus       Plan:     Continue PNV Eat often Return in 1 day for dating [place] and in 1 week for New OB Review handouts on First trimester and by [facility]

## 2018-08-27 ENCOUNTER — Ambulatory Visit (INDEPENDENT_AMBULATORY_CARE_PROVIDER_SITE_OTHER): Payer: Medicaid Other

## 2018-08-27 DIAGNOSIS — O3680X0 Pregnancy with inconclusive fetal viability, not applicable or unspecified: Secondary | ICD-10-CM

## 2018-08-27 NOTE — Progress Notes (Signed)
[place] 8+2 wks,single IUP w/ys,positive fht 169 bpm,normal ovaries bilat,crl 18.34 mm

## 2018-09-11 ENCOUNTER — Ambulatory Visit (INDEPENDENT_AMBULATORY_CARE_PROVIDER_SITE_OTHER): Payer: Medicaid Other | Admitting: Advanced Practice Midwife

## 2018-09-11 ENCOUNTER — Ambulatory Visit: Payer: Medicaid Other | Admitting: *Deleted

## 2018-09-11 ENCOUNTER — Encounter: Payer: Self-pay | Admitting: Advanced Practice Midwife

## 2018-09-11 VITALS — BP 116/71 | HR 99 | Wt 161.0 lb

## 2018-09-11 DIAGNOSIS — Z3A12 12 weeks gestation of pregnancy: Secondary | ICD-10-CM | POA: Diagnosis not present

## 2018-09-11 DIAGNOSIS — R81 Glycosuria: Secondary | ICD-10-CM

## 2018-09-11 DIAGNOSIS — Z1389 Encounter for screening for other disorder: Secondary | ICD-10-CM

## 2018-09-11 DIAGNOSIS — Z3481 Encounter for supervision of other normal pregnancy, first trimester: Secondary | ICD-10-CM | POA: Diagnosis not present

## 2018-09-11 DIAGNOSIS — Z3490 Encounter for supervision of normal pregnancy, unspecified, unspecified trimester: Secondary | ICD-10-CM | POA: Insufficient documentation

## 2018-09-11 DIAGNOSIS — Z3A10 10 weeks gestation of pregnancy: Secondary | ICD-10-CM

## 2018-09-11 DIAGNOSIS — Z131 Encounter for screening for diabetes mellitus: Secondary | ICD-10-CM | POA: Diagnosis not present

## 2018-09-11 DIAGNOSIS — Z331 Pregnant state, incidental: Secondary | ICD-10-CM

## 2018-09-11 LAB — POCT URINALYSIS DIPSTICK OB
Blood, UA: NEGATIVE
LEUKOCYTES UA: NEGATIVE
Nitrite, UA: NEGATIVE
POC,PROTEIN,UA: NEGATIVE

## 2018-09-11 LAB — GLUCOSE, POCT (MANUAL RESULT ENTRY): POC GLUCOSE: 200 mg/dL — AB (ref 70–99)

## 2018-09-11 NOTE — Progress Notes (Signed)
hgba1INITIAL OBSTETRICAL VISIT Patient name: Michele Bates MRN [id number]  Date of birth: 1991/08/15 Chief Complaint:   Initial Prenatal Visit  History of Present Illness:   AI SONNENFELD is a 27 y.o. G26P1011 Hispanic female at [redacted]w[redacted]d by early [place] with an Estimated Date of Delivery: 04/06/19 being seen today for her initial obstetrical visit.   Her obstetrical history is significant for term SVD w/o problems.   Today she reports fatigue.  Patient's last menstrual period was 06/14/2018. Last pap 2018 at [facility]. Results were: normal Review of Systems:   Pertinent items are noted in HPI Denies cramping/contractions, leakage of fluid, vaginal bleeding, abnormal vaginal discharge w/ itching/odor/irritation, headaches, visual changes, shortness of breath, chest pain, abdominal pain, severe nausea/vomiting, or problems with urination or bowel movements unless otherwise stated above.  Pertinent History Reviewed:  Reviewed past medical,surgical, social, obstetrical and family history.  Reviewed problem list, medications and allergies. OB History  Gravida Para Term Preterm AB Living  3 1 1   1 1   SAB TAB Ectopic Multiple Live Births  1       1    # Outcome Date GA Lbr Len/2nd Weight Sex Delivery Anes PTL Lv  3 Current           2 SAB 10/22/17 [redacted]w[redacted]d         1 Term 08/18/11 [redacted]w[redacted]d  7 lb 5 oz (3.317 kg) F Vag-Spont EPI N LIV   Physical Assessment:   Vitals:   09/11/18 1509  BP: 116/71  Pulse: 99  Weight: 161 lb (73 kg)  Body mass index is 29.45 kg/m.       Physical Examination:  General appearance - well appearing, and in no distress  Mental status - alert, oriented to person, place, and time  Psych:  She has a normal mood and affect  Skin - warm and dry, normal color, no suspicious lesions noted  Chest - effort normal, all lung fields clear to auscultation bilaterally  Heart - normal rate and regular rhythm  Abdomen - soft, nontender  Extremities:  No swelling or varicosities  noted    Results for orders placed or performed in visit on 09/11/18 (from the past 24 hour(s))  POC Urinalysis Dipstick OB   Collection Time: 09/11/18  3:28 PM  Result Value Ref Range   Color, UA     Clarity, UA     Glucose, UA Small (1+) (A) Negative   Bilirubin, UA     Ketones, UA moderate    Spec Grav, UA     Blood, UA neg    pH, UA     POC,PROTEIN,UA Negative Negative, Trace   Urobilinogen, UA     Nitrite, UA neg    Leukocytes, UA Negative Negative   Appearance     Odor    POCT glucose (manual entry)   Collection Time: 09/11/18  3:40 PM  Result Value Ref Range   POC Glucose 200 (A) 70 - 99 mg/dl    Assessment & Plan:  1) Low-Risk Pregnancy G3P1011 at [redacted]w[redacted]d with an Estimated Date of Delivery: 04/06/19   2) Initial OB visit  3)  Elevated random glucose of 200;  Check Hgb A1c  Meds: No orders of the defined types were placed in this encounter.   Initial labs obtained Continue prenatal vitamins Reviewed n/v relief measures and warning s/s to report Reviewed recommended weight gain based on pre-gravid BMI Encouraged well-balanced diet Genetic Screening discussed Integrated Screen: declined Cystic fibrosis screening discussed  declined Ultrasound discussed; fetal survey: requested CCNC completed  Follow-up: Return in about 4 weeks (around 10/09/2018) for LROB.   Orders Placed This Encounter  Procedures  . GC/Chlamydia Probe Amp  . Urine Culture  . Obstetric Panel, Including HIV  . Urinalysis, Routine w reflex microscopic  . Sickle cell screen  . Pain Management Screening Profile (10S)  . HgB A1c  . POC Urinalysis Dipstick OB  . POCT glucose (manual entry)    Jacklyn Shell DNP, CNM 09/11/2018 3:56 PM

## 2018-09-11 NOTE — Patient Instructions (Signed)
 First Trimester of Pregnancy The first trimester of pregnancy is from week 1 until the end of week 12 (months 1 through 3). A week after a sperm fertilizes an egg, the egg will implant on the wall of the uterus. This embryo will begin to develop into a baby. Genes from you and your partner are forming the baby. The female genes determine whether the baby is a boy or a girl. At 6-8 weeks, the eyes and face are formed, and the heartbeat can be seen on ultrasound. At the end of 12 weeks, all the baby's organs are formed.  Now that you are pregnant, you will want to do everything you can to have a healthy baby. Two of the most important things are to get good prenatal care and to follow your health care provider's instructions. Prenatal care is all the medical care you receive before the baby's birth. This care will help prevent, find, and treat any problems during the pregnancy and childbirth. BODY CHANGES Your body goes through many changes during pregnancy. The changes vary from woman to woman.   You may gain or lose a couple of pounds at first.  You may feel sick to your stomach (nauseous) and throw up (vomit). If the vomiting is uncontrollable, call your health care provider.  You may tire easily.  You may develop headaches that can be relieved by medicines approved by your health care provider.  You may urinate more often. Painful urination may mean you have a bladder infection.  You may develop heartburn as a result of your pregnancy.  You may develop constipation because certain hormones are causing the muscles that push waste through your intestines to slow down.  You may develop hemorrhoids or swollen, bulging veins (varicose veins).  Your breasts may begin to grow larger and become tender. Your nipples may stick out more, and the tissue that surrounds them (areola) may become darker.  Your gums may bleed and may be sensitive to brushing and flossing.  Dark spots or blotches  (chloasma, mask of pregnancy) may develop on your face. This will likely fade after the baby is born.  Your menstrual periods will stop.  You may have a loss of appetite.  You may develop cravings for certain kinds of food.  You may have changes in your emotions from day to day, such as being excited to be pregnant or being concerned that something may go wrong with the pregnancy and baby.  You may have more vivid and strange dreams.  You may have changes in your hair. These can include thickening of your hair, rapid growth, and changes in texture. Some women also have hair loss during or after pregnancy, or hair that feels dry or thin. Your hair will most likely return to normal after your baby is born. WHAT TO EXPECT AT YOUR PRENATAL VISITS During a routine prenatal visit:  You will be weighed to make sure you and the baby are growing normally.  Your blood pressure will be taken.  Your abdomen will be measured to track your baby's growth.  The fetal heartbeat will be listened to starting around week 10 or 12 of your pregnancy.  Test results from any previous visits will be discussed. Your health care provider may ask you:  How you are feeling.  If you are feeling the baby move.  If you have had any abnormal symptoms, such as leaking fluid, bleeding, severe headaches, or abdominal cramping.  If you have any questions. Other   tests that may be performed during your first trimester include:  Blood tests to find your blood type and to check for the presence of any previous infections. They will also be used to check for low iron levels (anemia) and Rh antibodies. Later in the pregnancy, blood tests for diabetes will be done along with other tests if problems develop.  Urine tests to check for infections, diabetes, or protein in the urine.  An ultrasound to confirm the proper growth and development of the baby.  An amniocentesis to check for possible genetic problems.  Fetal  screens for spina bifida and Down syndrome.  You may need other tests to make sure you and the baby are doing well. HOME CARE INSTRUCTIONS  Medicines  Follow your health care provider's instructions regarding medicine use. Specific medicines may be either safe or unsafe to take during pregnancy.  Take your prenatal vitamins as directed.  If you develop constipation, try taking a stool softener if your health care provider approves. Diet  Eat regular, well-balanced meals. Choose a variety of foods, such as meat or vegetable-based protein, fish, milk and low-fat dairy products, vegetables, fruits, and whole grain breads and cereals. Your health care provider will help you determine the amount of weight gain that is right for you.  Avoid raw meat and uncooked cheese. These carry germs that can cause birth defects in the baby.  Eating four or five small meals rather than three large meals a day may help relieve nausea and vomiting. If you start to feel nauseous, eating a few soda crackers can be helpful. Drinking liquids between meals instead of during meals also seems to help nausea and vomiting.  If you develop constipation, eat more high-fiber foods, such as fresh vegetables or fruit and whole grains. Drink enough fluids to keep your urine clear or pale yellow. Activity and Exercise  Exercise only as directed by your health care provider. Exercising will help you:  Control your weight.  Stay in shape.  Be prepared for labor and delivery.  Experiencing pain or cramping in the lower abdomen or low back is a good sign that you should stop exercising. Check with your health care provider before continuing normal exercises.  Try to avoid standing for long periods of time. Move your legs often if you must stand in one place for a long time.  Avoid heavy lifting.  Wear low-heeled shoes, and practice good posture.  You may continue to have sex unless your health care provider directs you  otherwise. Relief of Pain or Discomfort  Wear a good support bra for breast tenderness.   Take warm sitz baths to soothe any pain or discomfort caused by hemorrhoids. Use hemorrhoid cream if your health care provider approves.   Rest with your legs elevated if you have leg cramps or low back pain.  If you develop varicose veins in your legs, wear support hose. Elevate your feet for 15 minutes, 3-4 times a day. Limit salt in your diet. Prenatal Care  Schedule your prenatal visits by the twelfth week of pregnancy. They are usually scheduled monthly at first, then more often in the last 2 months before delivery.  Write down your questions. Take them to your prenatal visits.  Keep all your prenatal visits as directed by your health care provider. Safety  Wear your seat belt at all times when driving.  Make a list of emergency phone numbers, including numbers for family, friends, the hospital, and police and fire departments. General   Tips  Ask your health care provider for a referral to a local prenatal education class. Begin classes no later than at the beginning of month 6 of your pregnancy.  Ask for help if you have counseling or nutritional needs during pregnancy. Your health care provider can offer advice or refer you to specialists for help with various needs.  Do not use hot tubs, steam rooms, or saunas.  Do not douche or use tampons or scented sanitary pads.  Do not cross your legs for long periods of time.  Avoid cat litter boxes and soil used by cats. These carry germs that can cause birth defects in the baby and possibly loss of the fetus by miscarriage or stillbirth.  Avoid all smoking, herbs, alcohol, and medicines not prescribed by your health care provider. Chemicals in these affect the formation and growth of the baby.  Schedule a dentist appointment. At home, brush your teeth with a soft toothbrush and be gentle when you floss. SEEK MEDICAL CARE IF:   You have  dizziness.  You have mild pelvic cramps, pelvic pressure, or nagging pain in the abdominal area.  You have persistent nausea, vomiting, or diarrhea.  You have a bad smelling vaginal discharge.  You have pain with urination.  You notice increased swelling in your face, hands, legs, or ankles. SEEK IMMEDIATE MEDICAL CARE IF:   You have a fever.  You are leaking fluid from your vagina.  You have spotting or bleeding from your vagina.  You have severe abdominal cramping or pain.  You have rapid weight gain or loss.  You vomit blood or material that looks like coffee grounds.  You are exposed to German measles and have never had them.  You are exposed to fifth disease or chickenpox.  You develop a severe headache.  You have shortness of breath.  You have any kind of trauma, such as from a fall or a car accident. Document Released: 10/17/2001 Document Revised: 03/09/2014 Document Reviewed: 09/02/2013 Exit[employer] Patient Information 2015 [employer]. This information is not intended to replace advice given to you by your health care provider. Make sure you discuss any questions you have with your health care provider.   Nausea & Vomiting  Have saltine crackers or pretzels by your bed and eat a few bites before you raise your head out of bed in the morning  Eat small frequent meals throughout the day instead of large meals  Drink plenty of fluids throughout the day to stay hydrated, just don't drink a lot of fluids with your meals.  This can make your stomach fill up faster making you feel sick  Do not brush your teeth right after you eat  Products with real ginger are good for nausea, like ginger ale and ginger hard candy Make sure it says made with real ginger!  Sucking on sour candy like lemon heads is also good for nausea  If your prenatal vitamins make you nauseated, take them at night so you will sleep through the nausea  Sea Bands  If you feel like you need  medicine for the nausea & vomiting please let us know  If you are unable to keep any fluids or food down please let us know   Constipation  Drink plenty of fluid, preferably water, throughout the day  Eat foods high in fiber such as fruits, vegetables, and grains  Exercise, such as walking, is a good way to keep your bowels regular  Drink warm fluids, especially warm   prune juice, or decaf coffee  Eat a 1/2 cup of real oatmeal (not instant), 1/2 cup applesauce, and 1/2-1 cup warm prune juice every day  If needed, you may take Colace (docusate sodium) stool softener once or twice a day to help keep the stool soft. If you are pregnant, wait until you are out of your first trimester (12-14 weeks of pregnancy)  If you still are having problems with constipation, you may take Miralax once daily as needed to help keep your bowels regular.  If you are pregnant, wait until you are out of your first trimester (12-14 weeks of pregnancy)  Safe Medications in Pregnancy   Acne: Benzoyl Peroxide Salicylic Acid  Backache/Headache: Tylenol: 2 regular strength every 4 hours OR              2 Extra strength every 6 hours  Colds/Coughs/Allergies: Benadryl (alcohol free) 25 mg every 6 hours as needed Breath right strips Claritin Cepacol throat lozenges Chloraseptic throat spray Cold-Eeze- up to three times per day Cough drops, alcohol free Flonase (by prescription only) Guaifenesin Mucinex Robitussin DM (plain only, alcohol free) Saline nasal spray/drops Sudafed (pseudoephedrine) & Actifed ** use only after [redacted] weeks gestation and if you do not have high blood pressure Tylenol Vicks Vaporub Zinc lozenges Zyrtec   Constipation: Colace Ducolax suppositories Fleet enema Glycerin suppositories Metamucil Milk of magnesia Miralax Senokot Smooth move tea  Diarrhea: Kaopectate Imodium A-D  *NO pepto Bismol  Hemorrhoids: Anusol Anusol HC Preparation  H Tucks  Indigestion: Tums Maalox Mylanta Zantac  Pepcid  Insomnia: Benadryl (alcohol free) 25mg every 6 hours as needed Tylenol PM Unisom, no Gelcaps  Leg Cramps: Tums MagGel  Nausea/Vomiting:  Bonine Dramamine Emetrol Ginger extract Sea bands Meclizine  Nausea medication to take during pregnancy:  Unisom (doxylamine succinate 25 mg tablets) Take one tablet daily at bedtime. If symptoms are not adequately controlled, the dose can be increased to a maximum recommended dose of two tablets daily (1/2 tablet in the morning, 1/2 tablet mid-afternoon and one at bedtime). Vitamin B6 100mg tablets. Take one tablet twice a day (up to 200 mg per day).  Skin Rashes: Aveeno products Benadryl cream or 25mg every 6 hours as needed Calamine Lotion 1% cortisone cream  Yeast infection: Gyne-lotrimin 7 Monistat 7   **If taking multiple medications, please check labels to avoid duplicating the same active ingredients **take medication as directed on the label ** Do not exceed 4000 mg of tylenol in 24 hours **Do not take medications that contain aspirin or ibuprofen      

## 2018-09-12 LAB — OBSTETRIC PANEL, INCLUDING HIV
Antibody Screen: NEGATIVE
Basophils Absolute: 0 10*3/uL (ref 0.0–0.2)
Basos: 0 %
EOS (ABSOLUTE): 0.1 10*3/uL (ref 0.0–0.4)
Eos: 1 %
HEP B S AG: NEGATIVE
HIV Screen 4th Generation wRfx: NONREACTIVE
Hematocrit: 40.7 % (ref 34.0–46.6)
Hemoglobin: 13.5 g/dL (ref 11.1–15.9)
IMMATURE GRANS (ABS): 0 10*3/uL (ref 0.0–0.1)
Immature Granulocytes: 0 %
LYMPHS: 21 %
Lymphocytes Absolute: 1.9 10*3/uL (ref 0.7–3.1)
MCH: 30.5 pg (ref 26.6–33.0)
MCHC: 33.2 g/dL (ref 31.5–35.7)
MCV: 92 fL (ref 79–97)
MONOCYTES: 6 %
Monocytes Absolute: 0.5 10*3/uL (ref 0.1–0.9)
Neutrophils Absolute: 6.4 10*3/uL (ref 1.4–7.0)
Neutrophils: 72 %
PLATELETS: 275 10*3/uL (ref 150–450)
RBC: 4.43 x10E6/uL (ref 3.77–5.28)
RDW: 12.1 % — AB (ref 12.3–15.4)
RPR: NONREACTIVE
RUBELLA: 3.23 {index} (ref >0.99)
Rh Factor: POSITIVE
WBC: 9 10*3/uL (ref 3.4–10.8)

## 2018-09-12 LAB — URINALYSIS, ROUTINE W REFLEX MICROSCOPIC
Bilirubin, UA: NEGATIVE
Nitrite, UA: NEGATIVE
PH UA: 6.5 (ref 5.0–7.5)
Protein, UA: NEGATIVE
RBC UA: NEGATIVE
SPEC GRAV UA: 1.021 (ref 1.005–1.030)
UUROB: 0.2 mg/dL (ref 0.2–1.0)

## 2018-09-12 LAB — MICROSCOPIC EXAMINATION
Casts: NONE SEEN /lpf
Epithelial Cells (non renal): >10 /hpf — AB (ref 0–10)
RBC MICROSCOPIC, UA: NONE SEEN /HPF (ref 0–2)

## 2018-09-12 LAB — SICKLE CELL SCREEN: SICKLE CELL SCREEN: NEGATIVE

## 2018-09-13 ENCOUNTER — Telehealth: Payer: Self-pay | Admitting: *Deleted

## 2018-09-13 LAB — GC/CHLAMYDIA PROBE AMP
Chlamydia trachomatis, NAA: NEGATIVE
Neisseria gonorrhoeae by PCR: NEGATIVE

## 2018-09-13 LAB — URINE CULTURE

## 2018-09-13 NOTE — Telephone Encounter (Signed)
LMOVM for patient to return call. If she calls back, needs to schedule early 2hr gtt.

## 2018-09-17 ENCOUNTER — Other Ambulatory Visit: Payer: Medicaid Other

## 2018-09-17 DIAGNOSIS — Z3481 Encounter for supervision of other normal pregnancy, first trimester: Secondary | ICD-10-CM

## 2018-09-17 DIAGNOSIS — Z131 Encounter for screening for diabetes mellitus: Secondary | ICD-10-CM | POA: Diagnosis not present

## 2018-09-18 LAB — GLUCOSE TOLERANCE, 2 HOURS W/ 1HR
GLUCOSE, 2 HOUR: 142 mg/dL (ref 65–152)
Glucose, 1 hour: 150 mg/dL (ref 65–179)
Glucose, Fasting: 87 mg/dL (ref 65–91)

## 2018-09-19 LAB — SPECIMEN STATUS REPORT

## 2018-09-24 LAB — SPECIMEN STATUS REPORT

## 2018-09-24 LAB — HEMOGLOBIN A1C
Est. average glucose Bld gHb Est-mCnc: 100 mg/dL
Hgb A1c MFr Bld: 5.1 % (ref 4.8–5.6)

## 2018-10-09 ENCOUNTER — Encounter: Payer: Self-pay | Admitting: Obstetrics and Gynecology

## 2018-10-09 ENCOUNTER — Ambulatory Visit (INDEPENDENT_AMBULATORY_CARE_PROVIDER_SITE_OTHER): Payer: Medicaid Other | Admitting: Obstetrics and Gynecology

## 2018-10-09 ENCOUNTER — Other Ambulatory Visit: Payer: Self-pay

## 2018-10-09 VITALS — BP 115/66 | HR 100 | Wt 163.0 lb

## 2018-10-09 DIAGNOSIS — Z3A14 14 weeks gestation of pregnancy: Secondary | ICD-10-CM

## 2018-10-09 DIAGNOSIS — Z331 Pregnant state, incidental: Secondary | ICD-10-CM

## 2018-10-09 DIAGNOSIS — Z1389 Encounter for screening for other disorder: Secondary | ICD-10-CM

## 2018-10-09 DIAGNOSIS — Z3482 Encounter for supervision of other normal pregnancy, second trimester: Secondary | ICD-10-CM

## 2018-10-09 LAB — POCT URINALYSIS DIPSTICK OB
Blood, UA: NEGATIVE
Glucose, UA: NEGATIVE
Ketones, UA: NEGATIVE
LEUKOCYTES UA: NEGATIVE
NITRITE UA: NEGATIVE
PROTEIN: NEGATIVE

## 2018-10-09 NOTE — Progress Notes (Signed)
Patient ID: Michele Bates Lezama, female   DOB: 03/08/1991, 27 y.o.   MRN: [id number]    LOW-RISK PREGNANCY VISIT Patient name: Michele Bates Remus MRN [id number]  Date of birth: 03/08/1991 Chief Complaint:   Routine Prenatal Visit  History of Present Illness:   Michele Bates Riffe is a 27 y.o. 583P1011 female at 39110w3d with an Estimated Date of Delivery: 04/06/19 being seen today for ongoing management of a low-risk pregnancy. This is her second child and her first child is 26 years old. She [place]. Today she reports no complaints.  . Vag. Bleeding: None.   . denies leaking of fluid. Review of Systems:   Pertinent items are noted in HPI Denies abnormal vaginal discharge w/ itching/odor/irritation, headaches, visual changes, shortness of breath, chest pain, abdominal pain, severe nausea/vomiting, or problems with urination or bowel movements unless otherwise stated above. Pertinent History Reviewed:  Reviewed past medical,surgical, social, obstetrical and family history.  Reviewed problem list, medications and allergies. Physical Assessment:   Vitals:   10/09/18 0900  BP: 115/66  Pulse: 100  Weight: 163 lb (73.9 kg)  Body mass index is 29.81 kg/m.        Physical Examination:   General appearance: Well appearing, and in no distress  Mental status: Alert, oriented to person, place, and time  Skin: Warm & dry  Cardiovascular: Normal heart rate noted  Respiratory: Normal respiratory effort, no distress  Abdomen: Soft, gravid, nontender  Pelvic: Cervical exam deferred         Extremities: Edema: None  Fetal Status:          Results for orders placed or performed in visit on 10/09/18 (from the past 24 hour(s))  POC Urinalysis Dipstick OB   Collection Time: 10/09/18  9:00 AM  Result Value Ref Range   Color, UA     Clarity, UA     Glucose, UA Negative Negative   Bilirubin, UA     Ketones, UA neg    Spec Grav, UA     Blood, UA neg    pH, UA     POC,PROTEIN,UA Negative Negative, Trace, Small (1+), Moderate (2+), Large (3+), 4+   Urobilinogen, UA     Nitrite, UA neg    Leukocytes, UA Negative Negative   Appearance     Odor      Assessment & Plan:  1) Low-risk pregnancy G3P1011 at 56110w3d with an Estimated Date of Delivery: 04/06/19    Meds: No orders of the defined types were placed in this encounter.  Labs/procedures today: None  Plan:   1. Continue routine obstetrical care  2. F/u in 5 weeks with u/s: anatomy   Follow-up: No follow-ups on file.  Orders Placed This Encounter  Procedures  . POC Urinalysis Dipstick OB   By signing my name below, I, Arnette NorrisMari Johnson, attest that this documentation has been prepared under the direction and in the presence of Tilda BurrowFerguson, Joangel Vanosdol V, MD. Electronically Signed: Arnette NorrisMari Johnson Medical Scribe. 10/09/18. 9:13 AM.  I personally performed the services described in this documentation, which was SCRIBED in my presence. The recorded information has been reviewed and considered accurate. It has been edited as necessary during review. Tilda BurrowJohn V Georgios Kina, MD

## 2018-11-06 NOTE — L&D Delivery Note (Signed)
[facility] Delivery Note  Michele Bates is a 28 y.o. G3P1011 s/p SVD at [redacted]w[redacted]d. She was admitted for early labor and augmentation for gestational hypertension.   ROM: 7h 38m with clear fluid GBS Status: negative Maximum Maternal Temperature: Temp (24hrs), Avg:98.7 F (37.1 C), Min:98.1 F (36.7 C), Max:99.2 F (37.3 C)  Labor Progress: . Augmentation started with FB, cytotec . SROM with placement of FB . Transitioned to pitocin . Epidural placed . Progressed to complete  Delivery Date/Time: 03/23/19 at 0136 Delivery: Called to room and patient was complete and pushing. Head delivered ROA. Loose nuchal cord present and reduced after delivery. Shoulder and body delivered in usual fashion. Infant with spontaneous cry, placed on mother's abdomen, dried and stimulated. Cord clamped x 2 after 1-minute delay, and cut by father of baby. Cord blood drawn. Placenta delivered spontaneously with gentle cord traction. Fundus firm with massage and Pitocin. Labia, perineum, vagina, and cervix inspected inspected with no lacerations.   Placenta: spontaneous, intact, 3-vessel cord (to be discarded) Complications: none immediate Lacerations: none EBL: 272cc per triton Analgesia: epidural   Postpartum Planning [x]  message to sent to schedule follow-up  [x]  vaccines UTD  Infant: Vigorous female  APGARs 8, 9  weight pending but appears AGA  Michele Cumbo S. Earlene Plater, DO OB/GYN Fellow, Faculty Practice

## 2018-11-12 ENCOUNTER — Other Ambulatory Visit: Payer: Self-pay | Admitting: Obstetrics and Gynecology

## 2018-11-12 DIAGNOSIS — Z363 Encounter for antenatal screening for malformations: Secondary | ICD-10-CM

## 2018-11-13 ENCOUNTER — Ambulatory Visit (INDEPENDENT_AMBULATORY_CARE_PROVIDER_SITE_OTHER): Payer: Medicaid Other | Admitting: Women's Health

## 2018-11-13 ENCOUNTER — Ambulatory Visit (INDEPENDENT_AMBULATORY_CARE_PROVIDER_SITE_OTHER): Payer: Medicaid Other

## 2018-11-13 ENCOUNTER — Encounter: Payer: Self-pay | Admitting: Women's Health

## 2018-11-13 VITALS — BP 107/66 | HR 82 | Wt 164.5 lb

## 2018-11-13 DIAGNOSIS — Z331 Pregnant state, incidental: Secondary | ICD-10-CM

## 2018-11-13 DIAGNOSIS — R81 Glycosuria: Secondary | ICD-10-CM | POA: Insufficient documentation

## 2018-11-13 DIAGNOSIS — Z1389 Encounter for screening for other disorder: Secondary | ICD-10-CM

## 2018-11-13 DIAGNOSIS — Z3482 Encounter for supervision of other normal pregnancy, second trimester: Secondary | ICD-10-CM

## 2018-11-13 DIAGNOSIS — Z363 Encounter for antenatal screening for malformations: Secondary | ICD-10-CM

## 2018-11-13 DIAGNOSIS — Z3A19 19 weeks gestation of pregnancy: Secondary | ICD-10-CM | POA: Diagnosis not present

## 2018-11-13 LAB — POCT URINALYSIS DIPSTICK OB
Blood, UA: NEGATIVE
Glucose, UA: NEGATIVE
Ketones, UA: NEGATIVE
NITRITE UA: NEGATIVE
PROTEIN: NEGATIVE

## 2018-11-13 NOTE — Progress Notes (Signed)
[place] 19+3 wks,breech,posterior placenta gr 0,normal ovaries bilat,LVEICF,FHR 148 bpm,cx 3.9 cm,svp of fluid 5.5 cm,EFW 299 g 52%,anatomy complete

## 2018-11-13 NOTE — Patient Instructions (Signed)
Michele Bates, I greatly value your feedback.  If you receive a survey following your visit with [place] today, we appreciate you taking the time to fill it out.  Thanks, Michele Bates, CNM, WHNP-BC   Second Trimester of Pregnancy The second trimester is from week 14 through week 27 (months 4 through 6). The second trimester is often a time when you feel your best. Your body has adjusted to being pregnant, and you begin to feel better physically. Usually, morning sickness has lessened or quit completely, you may have more energy, and you may have an increase in appetite. The second trimester is also a time when the fetus is growing rapidly. At the end of the sixth month, the fetus is about 9 inches long and weighs about 1 pounds. You will likely begin to feel the baby move (quickening) between 16 and 20 weeks of pregnancy. Body changes during your second trimester Your body continues to go through many changes during your second trimester. The changes vary from woman to woman.  Your weight will continue to increase. You will notice your lower abdomen bulging out.  You may begin to get stretch marks on your hips, abdomen, and breasts.  You may develop headaches that can be relieved by medicines. The medicines should be approved by your health care provider.  You may urinate more often because the fetus is pressing on your bladder.  You may develop or continue to have heartburn as a result of your pregnancy.  You may develop constipation because certain hormones are causing the muscles that push waste through your intestines to slow down.  You may develop hemorrhoids or swollen, bulging veins (varicose veins).  You may have back pain. This is caused by: ? Weight gain. ? Pregnancy hormones that are relaxing the joints in your pelvis. ? A shift in weight and the muscles that support your balance.  Your breasts will continue to grow and they will continue to become tender.  Your gums may bleed  and may be sensitive to brushing and flossing.  Dark spots or blotches (chloasma, mask of pregnancy) may develop on your face. This will likely fade after the baby is born.  A dark line from your belly button to the pubic area (linea nigra) may appear. This will likely fade after the baby is born.  You may have changes in your hair. These can include thickening of your hair, rapid growth, and changes in texture. Some women also have hair loss during or after pregnancy, or hair that feels dry or thin. Your hair will most likely return to normal after your baby is born.  What to expect at prenatal visits During a routine prenatal visit:  You will be weighed to make sure you and the fetus are growing normally.  Your blood pressure will be taken.  Your abdomen will be measured to track your baby's growth.  The fetal heartbeat will be listened to.  Any test results from the previous visit will be discussed.  Your health care provider may ask you:  How you are feeling.  If you are feeling the baby move.  If you have had any abnormal symptoms, such as leaking fluid, bleeding, severe headaches, or abdominal cramping.  If you are using any tobacco products, including cigarettes, chewing tobacco, and electronic cigarettes.  If you have any questions.  Other tests that may be performed during your second trimester include:  Blood tests that check for: ? Low iron levels (anemia). ? High  blood sugar that affects pregnant women (gestational diabetes) between 3 and 28 weeks. ? Rh antibodies. This is to check for a protein on red blood cells (Rh factor).  Urine tests to check for infections, diabetes, or protein in the urine.  An ultrasound to confirm the proper growth and development of the baby.  An amniocentesis to check for possible genetic problems.  Fetal screens for spina bifida and Down syndrome.  HIV (human immunodeficiency virus) testing. Routine prenatal testing includes  screening for HIV, unless you choose not to have this test.  Follow these instructions at home: Medicines  Follow your health care provider's instructions regarding medicine use. Specific medicines may be either safe or unsafe to take during pregnancy.  Take a prenatal vitamin that contains at least 600 micrograms (mcg) of folic acid.  If you develop constipation, try taking a stool softener if your health care provider approves. Eating and drinking  Eat a balanced diet that includes fresh fruits and vegetables, whole grains, good sources of protein such as meat, eggs, or tofu, and low-fat dairy. Your health care provider will help you determine the amount of weight gain that is right for you.  Avoid raw meat and uncooked cheese. These carry germs that can cause birth defects in the baby.  If you have low calcium intake from food, talk to your health care provider about whether you should take a daily calcium supplement.  Limit foods that are high in fat and processed sugars, such as fried and sweet foods.  To prevent constipation: ? Drink enough fluid to keep your urine clear or pale yellow. ? Eat foods that are high in fiber, such as fresh fruits and vegetables, whole grains, and beans. Activity  Exercise only as directed by your health care provider. Most women can continue their usual exercise routine during pregnancy. Try to exercise for 30 minutes at least 5 days a week. Stop exercising if you experience uterine contractions.  Avoid heavy lifting, wear low heel shoes, and practice good posture.  A sexual relationship may be continued unless your health care provider directs you otherwise. Relieving pain and discomfort  Wear a good support bra to prevent discomfort from breast tenderness.  Take warm sitz baths to soothe any pain or discomfort caused by hemorrhoids. Use hemorrhoid cream if your health care provider approves.  Rest with your legs elevated if you have leg cramps  or low back pain.  If you develop varicose veins, wear support hose. Elevate your feet for 15 minutes, 3-4 times a day. Limit salt in your diet. Prenatal Care  Write down your questions. Take them to your prenatal visits.  Keep all your prenatal visits as told by your health care provider. This is important. Safety  Wear your seat belt at all times when driving.  Make a list of emergency phone numbers, including numbers for family, friends, the hospital, and police and fire departments. General instructions  Ask your health care provider for a referral to a local prenatal education class. Begin classes no later than the beginning of month 6 of your pregnancy.  Ask for help if you have counseling or nutritional needs during pregnancy. Your health care provider can offer advice or refer you to specialists for help with various needs.  Do not use hot tubs, steam rooms, or saunas.  Do not douche or use tampons or scented sanitary pads.  Do not cross your legs for long periods of time.  Avoid cat litter boxes and soil  used by cats. These carry germs that can cause birth defects in the baby and possibly loss of the fetus by miscarriage or stillbirth.  Avoid all smoking, herbs, alcohol, and unprescribed drugs. Chemicals in these products can affect the formation and growth of the baby.  Do not use any products that contain nicotine or tobacco, such as cigarettes and e-cigarettes. If you need help quitting, ask your health care provider.  Visit your dentist if you have not gone yet during your pregnancy. Use a soft toothbrush to brush your teeth and be gentle when you floss. Contact a health care provider if:  You have dizziness.  You have mild pelvic cramps, pelvic pressure, or nagging pain in the abdominal area.  You have persistent nausea, vomiting, or diarrhea.  You have a bad smelling vaginal discharge.  You have pain when you urinate. Get help right away if:  You have a  fever.  You are leaking fluid from your vagina.  You have spotting or bleeding from your vagina.  You have severe abdominal cramping or pain.  You have rapid weight gain or weight loss.  You have shortness of breath with chest pain.  You notice sudden or extreme swelling of your face, hands, ankles, feet, or legs.  You have not felt your baby move in over an hour.  You have severe headaches that do not go away when you take medicine.  You have vision changes. Summary  The second trimester is from week 14 through week 27 (months 4 through 6). It is also a time when the fetus is growing rapidly.  Your body goes through many changes during pregnancy. The changes vary from woman to woman.  Avoid all smoking, herbs, alcohol, and unprescribed drugs. These chemicals affect the formation and growth your baby.  Do not use any tobacco products, such as cigarettes, chewing tobacco, and e-cigarettes. If you need help quitting, ask your health care provider.  Contact your health care provider if you have any questions. Keep all prenatal visits as told by your health care provider. This is important. This information is not intended to replace advice given to you by your health care provider. Make sure you discuss any questions you have with your health care provider. Document Released: 10/17/2001 Document Revised: 03/30/2016 Document Reviewed: 12/24/2012 Elsevier Interactive Patient Education  2017 [employer].

## 2018-11-13 NOTE — Progress Notes (Signed)
   LOW-RISK PREGNANCY VISIT Patient name: Michele Bates MRN [id number]  Date of birth: Jan 05, 1991 Chief Complaint:   Routine Prenatal Visit ([place] today)  History of Present Illness:   Michele Bates is a 28 y.o. G32P1011 female at [redacted]w[redacted]d with an Estimated Date of Delivery: 04/06/19 being seen today for ongoing management of a low-risk pregnancy.  Today she reports no complaints. Contractions: Not present. Vag. Bleeding: None.  Movement: Present. denies leaking of fluid. Review of Systems:   Pertinent items are noted in HPI Denies abnormal vaginal discharge w/ itching/odor/irritation, headaches, visual changes, shortness of breath, chest pain, abdominal pain, severe nausea/vomiting, or problems with urination or bowel movements unless otherwise stated above. Pertinent History Reviewed:  Reviewed past medical,surgical, social, obstetrical and family history.  Reviewed problem list, medications and allergies. Physical Assessment:   Vitals:   11/13/18 0928  BP: 107/66  Pulse: 82  Weight: 164 lb 8 oz (74.6 kg)  Body mass index is 30.09 kg/m.        Physical Examination:   General appearance: Well appearing, and in no distress  Mental status: Alert, oriented to person, place, and time  Skin: Warm & dry  Cardiovascular: Normal heart rate noted  Respiratory: Normal respiratory effort, no distress  Abdomen: Soft, gravid, nontender  Pelvic: Cervical exam deferred         Extremities: Edema: Trace  Fetal Status: Fetal Heart Rate (bpm): 148 u/s   Movement: Present    [place] 19+3 wks,breech,posterior placenta gr 0,normal ovaries bilat,LVEICF,FHR 148 bpm,cx 3.9 cm,svp of fluid 5.5 cm,EFW 299 g 52%,anatomy complete  Results for orders placed or performed in visit on 11/13/18 (from the past 24 hour(s))  POC Urinalysis Dipstick OB   Collection Time: 11/13/18  9:29 AM  Result Value Ref Range   Color, UA     Clarity, UA     Glucose, UA Negative Negative   Bilirubin, UA     Ketones, UA neg    Spec Grav, UA     Blood, UA neg    pH, UA     POC,PROTEIN,UA Negative Negative, Trace, Small (1+), Moderate (2+), Large (3+), 4+   Urobilinogen, UA     Nitrite, UA neg    Leukocytes, UA Small (1+) (A) Negative   Appearance     Odor      Assessment & Plan:  1) Low-risk pregnancy G3P1011 at [redacted]w[redacted]d with an Estimated Date of Delivery: 04/06/19   2) Fetal isolated Lt EICF, declined genetic screening earlier, offered AFP vs. Repeat u/s later, wants to do AFP today.    Meds: No orders of the defined types were placed in this encounter.  Labs/procedures today: anatomy u/s, AFP  Plan:  Continue routine obstetrical care   Reviewed: Preterm labor symptoms and general obstetric precautions including but not limited to vaginal bleeding, contractions, leaking of fluid and fetal movement were reviewed in detail with the patient.  All questions were answered  Follow-up: Return in about 4 weeks (around 12/11/2018) for LROB.  Orders Placed This Encounter  Procedures  . AFP TETRA  . POC Urinalysis Dipstick OB   Cheral Marker CNM, [facility] 11/13/2018 10:16 AM

## 2018-11-16 LAB — AFP TETRA
DIA Mom Value: 0.78
DIA Value (EIA): 132.1 pg/mL
DSR (BY AGE) 1 IN: 948
DSR (SECOND TRIMESTER) 1 IN: 10000
Gestational Age: 19.3 WEEKS
MSAFP MOM: 0.81
MSAFP: 39.2 ng/mL
MSHCG MOM: 0.59
MSHCG: 13788 m[IU]/mL
Maternal Age At EDD: 26.6 yr
Osb Risk: 10000
TEST RESULTS AFP: NEGATIVE
WEIGHT: 165 [lb_av]
uE3 Mom: 1.07
uE3 Value: 1.84 ng/mL

## 2018-12-11 ENCOUNTER — Ambulatory Visit (INDEPENDENT_AMBULATORY_CARE_PROVIDER_SITE_OTHER): Payer: Medicaid Other | Admitting: Obstetrics and Gynecology

## 2018-12-11 ENCOUNTER — Encounter: Payer: Self-pay | Admitting: Obstetrics and Gynecology

## 2018-12-11 VITALS — BP 118/66 | HR 92 | Wt 170.4 lb

## 2018-12-11 DIAGNOSIS — Z331 Pregnant state, incidental: Secondary | ICD-10-CM

## 2018-12-11 DIAGNOSIS — Z3A23 23 weeks gestation of pregnancy: Secondary | ICD-10-CM

## 2018-12-11 DIAGNOSIS — Z3482 Encounter for supervision of other normal pregnancy, second trimester: Secondary | ICD-10-CM

## 2018-12-11 DIAGNOSIS — Z1389 Encounter for screening for other disorder: Secondary | ICD-10-CM

## 2018-12-11 LAB — POCT URINALYSIS DIPSTICK OB
Blood, UA: NEGATIVE
Glucose, UA: NEGATIVE
Ketones, UA: NEGATIVE
Nitrite, UA: NEGATIVE
PROTEIN: NEGATIVE

## 2018-12-11 NOTE — Progress Notes (Signed)
Patient ID: Michele Bates, female   DOB: May 11, 1991, 28 y.o.   MRN: [id number]    LOW-RISK PREGNANCY VISIT Patient name: Michele Bates MRN [id number]  Date of birth: 01/10/1991 Chief Complaint:   Routine Prenatal Visit  History of Present Illness:   Michele Bates is a 28 y.o. G46P1011 female at [redacted]w[redacted]d with an Estimated Date of Delivery: 04/06/19 being seen today for ongoing management of a low-risk pregnancy.  Today she reports no complaints. Contractions: Not present. Vag. Bleeding: None.  Movement: Present. denies leaking of fluid. Michele Bates has questions about the small LVE ICF noted on anatomy scan.  I pulled up the pictures and reviewed them together with her today.  The LV CF is 1.8 mm, and considered normal variant remainder of anatomic survey was considered normal.  She is answered to patient's satisfaction Review of Systems:   Pertinent items are noted in HPI Denies abnormal vaginal discharge w/ itching/odor/irritation, headaches, visual changes, shortness of breath, chest pain, abdominal pain, severe nausea/vomiting, or problems with urination or bowel movements unless otherwise stated above. Pertinent History Reviewed:  Reviewed past medical,surgical, social, obstetrical and family history.  Reviewed problem list, medications and allergies. Physical Assessment:   Vitals:   12/11/18 0926  BP: 118/66  Pulse: 92  Weight: 170 lb 6.4 oz (77.3 kg)  Body mass index is 31.17 kg/m.        Physical Examination:   General appearance: Well appearing, and in no distress  Mental status: Alert, oriented to person, place, and time  Skin: Warm & dry  Cardiovascular: Normal heart rate noted  Respiratory: Normal respiratory effort, no distress  Abdomen: Soft, gravid, nontender  Pelvic: Cervical exam deferred         Extremities: Edema: Trace  Fetal Status: Fetal Heart Rate (bpm): 139 Fundal Height: 26 cm Movement: Present    Results for orders placed or performed in visit on  12/11/18 (from the past 24 hour(s))  POC Urinalysis Dipstick OB   Collection Time: 12/11/18  9:27 AM  Result Value Ref Range   Color, UA     Clarity, UA     Glucose, UA Negative Negative   Bilirubin, UA     Ketones, UA neg    Spec Grav, UA     Blood, UA neg    pH, UA     POC,PROTEIN,UA Negative Negative, Trace, Small (1+), Moderate (2+), Large (3+), 4+   Urobilinogen, UA     Nitrite, UA neg    Leukocytes, UA Trace (A) Negative   Appearance     Odor      Assessment & Plan:  1) Low-risk pregnancy G3P1011 at [redacted]w[redacted]d with an Estimated Date of Delivery: 04/06/19    Meds: No orders of the defined types were placed in this encounter.  Labs/procedures today: None  Plan:   1. Continue routine obstetrical care 2. F/u in 4 week  Follow-up: Return in about 4 weeks (around 01/08/2019).  Orders Placed This Encounter  Procedures  . POC Urinalysis Dipstick OB   By signing my name below, I, Arnette Norris, attest that this documentation has been prepared under the direction and in the presence of Tilda Burrow, MD. Electronically Signed: Arnette Norris Medical Scribe. 12/11/18. 9:33 AM.  I personally performed the services described in this documentation, which was SCRIBED in my presence. The recorded information has been reviewed and considered accurate. It has been edited as necessary during review. Tilda Burrow, MD

## 2018-12-13 DIAGNOSIS — J029 Acute pharyngitis, unspecified: Secondary | ICD-10-CM | POA: Diagnosis not present

## 2018-12-13 DIAGNOSIS — J069 Acute upper respiratory infection, unspecified: Secondary | ICD-10-CM | POA: Diagnosis not present

## 2018-12-13 DIAGNOSIS — Z331 Pregnant state, incidental: Secondary | ICD-10-CM | POA: Diagnosis not present

## 2019-01-08 ENCOUNTER — Ambulatory Visit (INDEPENDENT_AMBULATORY_CARE_PROVIDER_SITE_OTHER): Payer: Medicaid Other | Admitting: Women's Health

## 2019-01-08 ENCOUNTER — Encounter: Payer: Self-pay | Admitting: Women's Health

## 2019-01-08 VITALS — BP 114/67 | HR 95 | Wt 176.0 lb

## 2019-01-08 DIAGNOSIS — Z1389 Encounter for screening for other disorder: Secondary | ICD-10-CM

## 2019-01-08 DIAGNOSIS — O26892 Other specified pregnancy related conditions, second trimester: Secondary | ICD-10-CM

## 2019-01-08 DIAGNOSIS — Z3A27 27 weeks gestation of pregnancy: Secondary | ICD-10-CM | POA: Diagnosis not present

## 2019-01-08 DIAGNOSIS — Z3482 Encounter for supervision of other normal pregnancy, second trimester: Secondary | ICD-10-CM

## 2019-01-08 DIAGNOSIS — N898 Other specified noninflammatory disorders of vagina: Secondary | ICD-10-CM

## 2019-01-08 DIAGNOSIS — Z23 Encounter for immunization: Secondary | ICD-10-CM

## 2019-01-08 DIAGNOSIS — Z331 Pregnant state, incidental: Secondary | ICD-10-CM

## 2019-01-08 LAB — POCT URINALYSIS DIPSTICK OB
Blood, UA: NEGATIVE
Glucose, UA: NEGATIVE
NITRITE UA: NEGATIVE
PROTEIN: NEGATIVE

## 2019-01-08 LAB — POCT WET PREP (WET MOUNT)
Clue Cells Wet Prep Whiff POC: NEGATIVE
Trichomonas Wet Prep HPF POC: ABSENT

## 2019-01-08 NOTE — Patient Instructions (Signed)
Michele Bates, I greatly value your feedback.  If you receive a survey following your visit with [place] today, we appreciate you taking the time to fill it out.  Thanks, [name], CNM, [facility]  [facility] HAS MOVED!!! It is now [facility] at [facility] ([address] [place], [place] [address]) Entrance located off of [address] Free 24/7 valet parking     Call the office [phone]) or go to [facility] if:  You begin to have strong, frequent contractions  Your water breaks.  Sometimes it is a big gush of fluid, sometimes it is just a trickle that keeps getting your panties wet or running down your legs  You have vaginal bleeding.  It is normal to have a small amount of spotting if your cervix was checked.   You don't feel your baby moving like normal.  If you don't, get you something to eat and drink and lay down and focus on feeling your baby move.  You should feel at least 10 movements in 2 hours.  If you don't, you should call the office or go to [facility].    Tdap Vaccine  It is recommended that you get the Tdap vaccine during the third trimester of EACH pregnancy to help protect your baby from getting pertussis (whooping cough)  27-36 weeks is the BEST time to do this so that you can pass the protection on to your baby. During pregnancy is better than after pregnancy, but if you are unable to get it during pregnancy it will be offered at the hospital.   You can get this vaccine with [place], at the health department, your family doctor, or some local pharmacies  Everyone who will be around your baby should also be up-to-date on their vaccines before the baby comes. Adults (who are not pregnant) only need 1 dose of Tdap during adulthood.   Third Trimester of Pregnancy The third trimester is from week 29 through week 42, months 7 through 9. The third trimester is a time when the fetus is growing rapidly. At the end of the ninth month, the fetus is  about 20 inches in length and weighs 6-10 pounds.  BODY CHANGES Your body goes through many changes during pregnancy. The changes vary from woman to woman.   Your weight will continue to increase. You can expect to gain 25-35 pounds (11-16 kg) by the end of the pregnancy.  You may begin to get stretch marks on your hips, abdomen, and breasts.  You may urinate more often because the fetus is moving lower into your pelvis and pressing on your bladder.  You may develop or continue to have heartburn as a result of your pregnancy.  You may develop constipation because certain hormones are causing the muscles that push waste through your intestines to slow down.  You may develop hemorrhoids or swollen, bulging veins (varicose veins).  You may have pelvic pain because of the weight gain and pregnancy hormones relaxing your joints between the bones in your pelvis. Backaches may result from overexertion of the muscles supporting your posture.  You may have changes in your hair. These can include thickening of your hair, rapid growth, and changes in texture. Some women also have hair loss during or after pregnancy, or hair that feels dry or thin. Your hair will most likely return to normal after your baby is born.  Your breasts will continue to grow and be tender. A yellow discharge may  leak from your breasts called colostrum.  Your belly button may stick out.  You may feel short of breath because of your expanding uterus.  You may notice the fetus "dropping," or moving lower in your abdomen.  You may have a bloody mucus discharge. This usually occurs a few days to a week before labor begins.  Your cervix becomes thin and soft (effaced) near your due date. WHAT TO EXPECT AT YOUR PRENATAL EXAMS  You will have prenatal exams every 2 weeks until week 36. Then, you will have weekly prenatal exams. During a routine prenatal visit:  You will be weighed to make sure you and the fetus are growing  normally.  Your blood pressure is taken.  Your abdomen will be measured to track your baby's growth.  The fetal heartbeat will be listened to.  Any test results from the previous visit will be discussed.  You may have a cervical check near your due date to see if you have effaced. At around 36 weeks, your caregiver will check your cervix. At the same time, your caregiver will also perform a test on the secretions of the vaginal tissue. This test is to determine if a type of bacteria, Group B streptococcus, is present. Your caregiver will explain this further. Your caregiver may ask you:  What your birth plan is.  How you are feeling.  If you are feeling the baby move.  If you have had any abnormal symptoms, such as leaking fluid, bleeding, severe headaches, or abdominal cramping.  If you have any questions. Other tests or screenings that may be performed during your third trimester include:  Blood tests that check for low iron levels (anemia).  Fetal testing to check the health, activity level, and growth of the fetus. Testing is done if you have certain medical conditions or if there are problems during the pregnancy. FALSE LABOR You may feel small, irregular contractions that eventually go away. These are called Braxton Hicks contractions, or false labor. Contractions may last for hours, days, or even weeks before true labor sets in. If contractions come at regular intervals, intensify, or become painful, it is best to be seen by your caregiver.  SIGNS OF LABOR   Menstrual-like cramps.  Contractions that are 5 minutes apart or less.  Contractions that start on the top of the uterus and spread down to the lower abdomen and back.  A sense of increased pelvic pressure or back pain.  A watery or bloody mucus discharge that comes from the vagina. If you have any of these signs before the 37th week of pregnancy, call your caregiver right away. You need to go to the hospital to  get checked immediately. HOME CARE INSTRUCTIONS   Avoid all smoking, herbs, alcohol, and unprescribed drugs. These chemicals affect the formation and growth of the baby.  Follow your caregiver's instructions regarding medicine use. There are medicines that are either safe or unsafe to take during pregnancy.  Exercise only as directed by your caregiver. Experiencing uterine cramps is a good sign to stop exercising.  Continue to eat regular, healthy meals.  Wear a good support bra for breast tenderness.  Do not use hot tubs, steam rooms, or saunas.  Wear your seat belt at all times when driving.  Avoid raw meat, uncooked cheese, cat litter boxes, and soil used by cats. These carry germs that can cause birth defects in the baby.  Take your prenatal vitamins.  Try taking a stool softener (if your caregiver  approves) if you develop constipation. Eat more high-fiber foods, such as fresh vegetables or fruit and whole grains. Drink plenty of fluids to keep your urine clear or pale yellow.  Take warm sitz baths to soothe any pain or discomfort caused by hemorrhoids. Use hemorrhoid cream if your caregiver approves.  If you develop varicose veins, wear support hose. Elevate your feet for 15 minutes, 3-4 times a day. Limit salt in your diet.  Avoid heavy lifting, wear low heal shoes, and practice good posture.  Rest a lot with your legs elevated if you have leg cramps or low back pain.  Visit your dentist if you have not gone during your pregnancy. Use a soft toothbrush to brush your teeth and be gentle when you floss.  A sexual relationship may be continued unless your caregiver directs you otherwise.  Do not travel far distances unless it is absolutely necessary and only with the approval of your caregiver.  Take prenatal classes to understand, practice, and ask questions about the labor and delivery.  Make a trial run to the hospital.  Pack your hospital bag.  Prepare the baby's  nursery.  Continue to go to all your prenatal visits as directed by your caregiver. SEEK MEDICAL CARE IF:  You are unsure if you are in labor or if your water has broken.  You have dizziness.  You have mild pelvic cramps, pelvic pressure, or nagging pain in your abdominal area.  You have persistent nausea, vomiting, or diarrhea.  You have a bad smelling vaginal discharge.  You have pain with urination. SEEK IMMEDIATE MEDICAL CARE IF:   You have a fever.  You are leaking fluid from your vagina.  You have spotting or bleeding from your vagina.  You have severe abdominal cramping or pain.  You have rapid weight loss or gain.  You have shortness of breath with chest pain.  You notice sudden or extreme swelling of your face, hands, ankles, feet, or legs.  You have not felt your baby move in over an hour.  You have severe headaches that do not go away with medicine.  You have vision changes. Document Released: 10/17/2001 Document Revised: 10/28/2013 Document Reviewed: 12/24/2012 [facility] Patient Information 2015 [place], [place]. This information is not intended to replace advice given to you by your health care provider. Make sure you discuss any questions you have with your health care provider.

## 2019-01-08 NOTE — Progress Notes (Signed)
LOW-RISK PREGNANCY VISIT Patient name: Michele Bates MRN [id number]  Date of birth: [birth year] Chief Complaint:   Routine Prenatal Visit  History of Present Illness:   Michele Bates is a 28 y.o. G60P1011 female at [redacted]w[redacted]d with an Estimated Date of Delivery: 04/06/19 being seen today for ongoing management of a low-risk pregnancy.  Today she reports leaking fluid x 1 last week, none since, just discharge. Contractions: Not present. Vag. Bleeding: None.  Movement: Present. reports leaking of fluid. Review of Systems:   Pertinent items are noted in HPI Denies abnormal vaginal discharge w/ itching/odor/irritation, headaches, visual changes, shortness of breath, chest pain, abdominal pain, severe nausea/vomiting, or problems with urination or bowel movements unless otherwise stated above. Pertinent History Reviewed:  Reviewed past medical,surgical, social, obstetrical and family history.  Reviewed problem list, medications and allergies. Physical Assessment:   Vitals:   01/08/19 0857  BP: 114/67  Pulse: 95  Weight: 176 lb (79.8 kg)  Body mass index is 32.19 kg/m.        Physical Examination:   General appearance: Well appearing, and in no distress  Mental status: Alert, oriented to person, place, and time  Skin: Warm & dry  Cardiovascular: Normal heart rate noted  Respiratory: Normal respiratory effort, no distress  Abdomen: Soft, gravid, nontender  Pelvic: SSE: cx visually closed, no pooling, no change w/ valsalva, fern & nitrazine neg, wet prep neg         Extremities: Edema: Trace  Fetal Status: Fetal Heart Rate (bpm): 145 Fundal Height: 27 cm Movement: Present    Results for orders placed or performed in visit on 01/08/19 (from the past 24 hour(s))  POC Urinalysis Dipstick OB   Collection Time: 01/08/19  8:55 AM  Result Value Ref Range   Color, UA     Clarity, UA     Glucose, UA Negative Negative   Bilirubin, UA     Ketones, UA small    Spec Grav, UA     Blood, UA  neg    pH, UA     POC,PROTEIN,UA Negative Negative, Trace, Small (1+), Moderate (2+), Large (3+), 4+   Urobilinogen, UA     Nitrite, UA neg    Leukocytes, UA Trace (A) Negative   Appearance     Odor    POCT Wet Prep [facility])   Collection Time: 01/08/19  9:24 AM  Result Value Ref Range   Source Wet Prep POC vaginal    WBC, Wet Prep HPF POC few    Bacteria Wet Prep HPF POC Few Few   BACTERIA WET PREP MORPHOLOGY POC     Clue Cells Wet Prep HPF POC None None   Clue Cells Wet Prep Whiff POC Negative Whiff    Yeast Wet Prep HPF POC None None   KOH Wet Prep POC     Trichomonas Wet Prep HPF POC Absent Absent    Assessment & Plan:  1) Low-risk pregnancy G3P1011 at [redacted]w[redacted]d with an Estimated Date of Delivery: 04/06/19   2) Normal vaginal d/c   Meds: No orders of the defined types were placed in this encounter.  Labs/procedures today: SSE, wet prep, tdap  Plan:  Continue routine obstetrical care   Reviewed: Preterm labor symptoms and general obstetric precautions including but not limited to vaginal bleeding, contractions, leaking of fluid and fetal movement were reviewed in detail with the patient.  All questions were answered  Follow-up: Return for asap pn2 (no visit), then 3wks for LROB.  Orders Placed This Encounter  Procedures  . Tdap vaccine greater than or equal to 7yo IM  . POC Urinalysis Dipstick OB  . POCT Wet Prep [facility])   Cheral Marker CNM, [facility] 01/08/2019 9:24 AM

## 2019-01-15 ENCOUNTER — Other Ambulatory Visit: Payer: Medicaid Other

## 2019-01-15 ENCOUNTER — Other Ambulatory Visit: Payer: Self-pay

## 2019-01-15 DIAGNOSIS — Z3480 Encounter for supervision of other normal pregnancy, unspecified trimester: Secondary | ICD-10-CM

## 2019-01-15 DIAGNOSIS — Z3A28 28 weeks gestation of pregnancy: Secondary | ICD-10-CM | POA: Diagnosis not present

## 2019-01-16 LAB — GLUCOSE TOLERANCE, 2 HOURS W/ 1HR
GLUCOSE, 1 HOUR: 148 mg/dL (ref 65–179)
GLUCOSE, 2 HOUR: 107 mg/dL (ref 65–152)
Glucose, Fasting: 89 mg/dL (ref 65–91)

## 2019-01-16 LAB — HIV ANTIBODY (ROUTINE TESTING W REFLEX): HIV Screen 4th Generation wRfx: NONREACTIVE

## 2019-01-16 LAB — CBC
Hematocrit: 33.3 % — ABNORMAL LOW (ref 34.0–46.6)
Hemoglobin: 11.1 g/dL (ref 11.1–15.9)
MCH: 31.2 pg (ref 26.6–33.0)
MCHC: 33.3 g/dL (ref 31.5–35.7)
MCV: 94 fL (ref 79–97)
PLATELETS: 249 10*3/uL (ref 150–450)
RBC: 3.56 x10E6/uL — ABNORMAL LOW (ref 3.77–5.28)
RDW: 12.8 % (ref 11.7–15.4)
WBC: 7.7 10*3/uL (ref 3.4–10.8)

## 2019-01-16 LAB — ANTIBODY SCREEN: Antibody Screen: NEGATIVE

## 2019-01-16 LAB — RPR: RPR: NONREACTIVE

## 2019-01-29 ENCOUNTER — Ambulatory Visit (INDEPENDENT_AMBULATORY_CARE_PROVIDER_SITE_OTHER): Payer: Medicaid Other | Admitting: Obstetrics and Gynecology

## 2019-01-29 ENCOUNTER — Other Ambulatory Visit: Payer: Self-pay

## 2019-01-29 ENCOUNTER — Encounter: Payer: Self-pay | Admitting: Obstetrics and Gynecology

## 2019-01-29 VITALS — BP 116/69 | HR 102 | Wt 180.0 lb

## 2019-01-29 DIAGNOSIS — Z3483 Encounter for supervision of other normal pregnancy, third trimester: Secondary | ICD-10-CM

## 2019-01-29 DIAGNOSIS — Z331 Pregnant state, incidental: Secondary | ICD-10-CM

## 2019-01-29 DIAGNOSIS — Z1389 Encounter for screening for other disorder: Secondary | ICD-10-CM

## 2019-01-29 DIAGNOSIS — Z3A30 30 weeks gestation of pregnancy: Secondary | ICD-10-CM

## 2019-01-29 LAB — POCT URINALYSIS DIPSTICK OB
Glucose, UA: NEGATIVE
KETONES UA: NEGATIVE
Leukocytes, UA: NEGATIVE
Nitrite, UA: NEGATIVE
POC,PROTEIN,UA: NEGATIVE
RBC UA: NEGATIVE

## 2019-01-29 NOTE — Progress Notes (Signed)
Patient ID: Michele Bates, female   DOB: October 05, 1991, 28 y.o.   MRN: [id number]    LOW-RISK PREGNANCY VISIT Patient name: Michele Bates MRN [id number]  Date of birth: 1991-06-29 Chief Complaint:   Routine Prenatal Visit  History of Present Illness:   NAKAI Bates is a 28 y.o. G13P1011 female at [redacted]w[redacted]d with an Estimated Date of Delivery: 04/06/19 being seen today for ongoing management of a low-risk pregnancy.  Today she reports no complaints. Contractions: Irregular. Vag. Bleeding: None.  Movement: Present. denies leaking of fluid. Review of Systems:   Pertinent items are noted in HPI Denies abnormal vaginal discharge w/ itching/odor/irritation, headaches, visual changes, shortness of breath, chest pain, abdominal pain, severe nausea/vomiting, or problems with urination or bowel movements unless otherwise stated above. Pertinent History Reviewed:  Reviewed past medical,surgical, social, obstetrical and family history.  Reviewed problem list, medications and allergies. Physical Assessment:   Vitals:   01/29/19 0851  BP: 116/69  Pulse: (!) 102  Weight: 180 lb (81.6 kg)  Body mass index is 32.92 kg/m.        Physical Examination:   General appearance: Well appearing, and in no distress  Mental status: Alert, oriented to person, place, and time  Skin: Warm & dry  Cardiovascular: Normal heart rate noted  Respiratory: Normal respiratory effort, no distress  Abdomen: Soft, gravid, nontender  Pelvic: Cervical exam deferred         Extremities: Edema: Trace  Fetal Status: Fetal Heart Rate (bpm): 146 Fundal Height: 32 cm Movement: Present    Results for orders placed or performed in visit on 01/29/19 (from the past 24 hour(s))  POC Urinalysis Dipstick OB   Collection Time: 01/29/19  8:50 AM  Result Value Ref Range   Color, UA     Clarity, UA     Glucose, UA Negative Negative   Bilirubin, UA     Ketones, UA neg    Spec Grav, UA     Blood, UA neg    pH, UA     POC,PROTEIN,UA Negative Negative, Trace, Small (1+), Moderate (2+), Large (3+), 4+   Urobilinogen, UA     Nitrite, UA neg    Leukocytes, UA Negative Negative   Appearance     Odor      Assessment & Plan:  1) Low-risk pregnancy G3P1011 at [redacted]w[redacted]d with an Estimated Date of Delivery: 04/06/19   2) Discussed kick counts,  10 kicks /2 hour baby is very active  Meds: No orders of the defined types were placed in this encounter.  Labs/procedures today: None  Plan:   1. Continue routine obstetrical care  2. F/u in 3 weeks, possible tele-health visit  Follow-up: 3  Orders Placed This Encounter  Procedures  . POC Urinalysis Dipstick OB   By signing my name below, I, Arnette Norris, attest that this documentation has been prepared under the direction and in the presence of Tilda Burrow, MD. Electronically Signed: Arnette Norris Medical Scribe. 01/29/19. 8:59 AM.  I personally performed the services described in this documentation, which was SCRIBED in my presence. The recorded information has been reviewed and considered accurate. It has been edited as necessary during review. Tilda Burrow, MD

## 2019-02-19 ENCOUNTER — Ambulatory Visit (INDEPENDENT_AMBULATORY_CARE_PROVIDER_SITE_OTHER): Payer: Medicaid Other | Admitting: Obstetrics and Gynecology

## 2019-02-19 ENCOUNTER — Encounter: Payer: Self-pay | Admitting: Obstetrics and Gynecology

## 2019-02-19 ENCOUNTER — Other Ambulatory Visit: Payer: Self-pay

## 2019-02-19 VITALS — BP 118/68 | HR 108 | Wt 181.0 lb

## 2019-02-19 DIAGNOSIS — Z331 Pregnant state, incidental: Secondary | ICD-10-CM

## 2019-02-19 DIAGNOSIS — Z3483 Encounter for supervision of other normal pregnancy, third trimester: Secondary | ICD-10-CM

## 2019-02-19 DIAGNOSIS — Z1389 Encounter for screening for other disorder: Secondary | ICD-10-CM

## 2019-02-19 DIAGNOSIS — Z3A33 33 weeks gestation of pregnancy: Secondary | ICD-10-CM

## 2019-02-19 LAB — POCT URINALYSIS DIPSTICK OB
Blood, UA: NEGATIVE
Glucose, UA: NEGATIVE
Nitrite, UA: NEGATIVE

## 2019-02-19 NOTE — Progress Notes (Signed)
   LOW-RISK PREGNANCY VISIT Patient name: Michele Bates MRN [id number]  Date of birth: 11-Oct-1991 Chief Complaint:   Routine Prenatal Visit  History of Present Illness:   Michele Bates is a 28 y.o. G69P1011 female at [redacted]w[redacted]d with an Estimated Date of Delivery: 04/06/19 being seen today for ongoing management of a low-risk pregnancy.  Today she reports no complaints and has had one episode of sharp abd pain, none otherwise.. Contractions: Irregular.  .  Movement: Present. denies leaking of fluid. Review of Systems:   Pertinent items are noted in HPI Denies abnormal vaginal discharge w/ itching/odor/irritation, headaches, visual changes, shortness of breath, chest pain, abdominal pain, severe nausea/vomiting, or problems with urination or bowel movements unless otherwise stated above. Pertinent History Reviewed:  Reviewed past medical,surgical, social, obstetrical and family history.  Reviewed problem list, medications and allergies. Physical Assessment:   Vitals:   02/19/19 0857  BP: 118/68  Pulse: (!) 108  Weight: 82.1 kg  Body mass index is 33.11 kg/m.        Physical Examination:   General appearance: Well appearing, and in no distress  Mental status: Alert, oriented to person, place, and time  Skin: Warm & dry  Cardiovascular: Normal heart rate noted  Respiratory: Normal respiratory effort, no distress  Abdomen: Soft, gravid, nontender  Pelvic: Cervical exam deferred         Extremities: Edema: Trace  Fetal Status:     Movement: Present    Results for orders placed or performed in visit on 02/19/19 (from the past 24 hour(s))  POC Urinalysis Dipstick OB   Collection Time: 02/19/19  9:05 AM  Result Value Ref Range   Color, UA     Clarity, UA     Glucose, UA Negative Negative   Bilirubin, UA     Ketones, UA moderate    Spec Grav, UA     Blood, UA neg    pH, UA     POC,PROTEIN,UA Trace Negative, Trace, Small (1+), Moderate (2+), Large (3+), 4+   Urobilinogen, UA      Nitrite, UA neg    Leukocytes, UA Moderate (2+) (A) Negative   Appearance     Odor      Assessment & Plan:  1) Low-risk pregnancy G3P1011 at [redacted]w[redacted]d with an Estimated Date of Delivery: 04/06/19      Meds: No orders of the defined types were placed in this encounter.  Labs/procedures today: Urine dipstick shows positive for leukocytes. Otherwise negative  Micro exam: not done.   Plan:  Continue routine obstetrical care , LROB or tele visit 2 wk, Cervix check 36-37 w,  Reviewed: Term labor symptoms and general obstetric precautions including but not limited to vaginal bleeding, contractions, leaking of fluid and fetal movement were reviewed in detail with the patient.  All questions were answered  Follow-up: 2 wj tele vusit pt will get bp cuff.  Orders Placed This Encounter  Procedures  . POC Urinalysis Dipstick OB   Tilda Burrow CNM, [facility] 02/19/2019 9:21 AM

## 2019-03-04 ENCOUNTER — Encounter: Payer: Self-pay | Admitting: *Deleted

## 2019-03-05 ENCOUNTER — Encounter: Payer: Self-pay | Admitting: Women's Health

## 2019-03-05 ENCOUNTER — Other Ambulatory Visit: Payer: Self-pay

## 2019-03-05 ENCOUNTER — Ambulatory Visit (INDEPENDENT_AMBULATORY_CARE_PROVIDER_SITE_OTHER): Payer: Medicaid Other | Admitting: Women's Health

## 2019-03-05 VITALS — BP 140/76 | HR 99 | Wt 182.2 lb

## 2019-03-05 DIAGNOSIS — R030 Elevated blood-pressure reading, without diagnosis of hypertension: Secondary | ICD-10-CM | POA: Diagnosis not present

## 2019-03-05 DIAGNOSIS — Z3483 Encounter for supervision of other normal pregnancy, third trimester: Secondary | ICD-10-CM

## 2019-03-05 DIAGNOSIS — Z3A35 35 weeks gestation of pregnancy: Secondary | ICD-10-CM

## 2019-03-05 NOTE — Patient Instructions (Addendum)
Michele Bates, I greatly value your feedback.  If you receive a survey following your visit with [place] today, we appreciate you taking the time to fill it out.  Thanks, [name], CNM, [facility]  [facility] HAS MOVED!!! It is now [facility] ([address] [place], [place] [address]) Entrance located off of [address] Free 24/7 valet parking    Call the office [phone]) or go to [facility] if:  You begin to have strong, frequent contractions  Your water breaks.  Sometimes it is a big gush of fluid, sometimes it is just a trickle that keeps getting your panties wet or running down your legs  You have vaginal bleeding.  It is normal to have a small amount of spotting if your cervix was checked.   You don't feel your baby moving like normal.  If you don't, get you something to eat and drink and lay down and focus on feeling your baby move.  You should feel at least 10 movements in 2 hours.  If you don't, you should call the office or go to [facility].  Call the office [phone]) or go to [facility] hospital for these signs of pre-eclampsia:  Severe headache that does not go away with Tylenol  Visual changes- seeing spots, double, blurred vision  Pain under your right breast or upper abdomen that does not go away with Tums or heartburn medicine  Nausea and/or vomiting  Severe swelling in your hands, feet, and face   Home Blood Pressure Monitoring for Patients   Your provider has recommended that you check your blood pressure (BP) at least once a week at home. If you do not have a blood pressure cuff at home, one will be provided for you. Contact your provider if you have not received your monitor within 1 week.   Helpful Tips for Accurate Home Blood Pressure Checks  . Don't smoke, exercise, or drink caffeine 30 minutes before checking your BP . Use the restroom before checking your BP (a full bladder can raise your pressure) . Relax in  a comfortable upright chair . Feet on the ground . Left arm resting comfortably on a flat surface at the level of your heart . Legs uncrossed . Back supported . Sit quietly and don't talk . Place the cuff on your bare arm . Adjust snuggly, so that only two fingertips can fit between your skin and the top of the cuff . Check 2 readings separated by at least one minute . Keep a log of your BP readings . For a visual, please reference this diagram: http://ccnc.care/bpdiagram  Provider Name: [facility]     Phone: [phone]  Zone 1: ALL CLEAR  Continue to monitor your symptoms:  . BP reading is less than 140 (top number) or less than 90 (bottom number)  . No right upper stomach pain . No headaches or seeing spots . No feeling nauseated or throwing up . No swelling in face and hands  Zone 2: CAUTION Call your doctor's office for any of the following:  . BP reading is greater than 140 (top number) or greater than 90 (bottom number)  . Stomach pain under your ribs in the middle or right side . Headaches or seeing spots . Feeling nauseated or throwing up . Swelling in face and hands  Zone 3: EMERGENCY  Seek immediate medical care if you have any of the following:  . BP reading is greater  [id number] (top number) or greater than 110 (bottom number) . Severe headaches not improving with Tylenol . Serious difficulty catching your breath . Any worsening symptoms from Zone 2    Preeclampsia and Eclampsia  Preeclampsia is a serious condition that may develop during pregnancy. It is also called toxemia of pregnancy. This condition causes high blood pressure along with other symptoms, such as swelling and headaches. These symptoms may develop as the condition gets worse. Preeclampsia may occur at 20 weeks of pregnancy or later. Diagnosing and treating preeclampsia early is very important. If not treated early, it can cause serious problems for you and your baby. One problem it can lead  to is eclampsia. Eclampsia is a condition that causes muscle jerking or shaking (convulsions or seizures) and other serious problems for the mother. During pregnancy, delivering your baby may be the best treatment for preeclampsia or eclampsia. For most women, preeclampsia and eclampsia symptoms go away after giving birth. In rare cases, a woman may develop preeclampsia after giving birth (postpartum preeclampsia). This usually occurs within 48 hours after childbirth but may occur up to 6 weeks after giving birth. What are the causes? The cause of preeclampsia is not known. What increases the risk? The following risk factors make you more likely to develop preeclampsia:  Being pregnant for the first time.  Having had preeclampsia during a past pregnancy.  Having a family history of preeclampsia.  Having high blood pressure.  Being pregnant with more than one baby.  Being 4735 or older.  Being African-American.  Having kidney disease or diabetes.  Having medical conditions such as lupus or blood diseases.  Being very overweight (obese). What are the signs or symptoms? The earliest signs of preeclampsia are:  High blood pressure.  Increased protein in your urine. Your health care provider will check for this at every visit before you give birth (prenatal visit). Other symptoms that may develop as the condition gets worse include:  Severe headaches.  Sudden weight gain.  Swelling of the hands, face, legs, and feet.  Nausea and vomiting.  Vision problems, such as blurred or double vision.  Numbness in the face, arms, legs, and feet.  Urinating less than usual.  Dizziness.  Slurred speech.  Abdominal pain, especially upper abdominal pain.  Convulsions or seizures. How is this diagnosed? There are no screening tests for preeclampsia. Your health care provider will ask you about symptoms and check for signs of preeclampsia during your prenatal visits. You may also have  tests that include:  Urine tests.  Blood tests.  Checking your blood pressure.  Monitoring your baby's heart rate.  Ultrasound. How is this treated? You and your health care provider will determine the treatment approach that is best for you. Treatment may include:  Having more frequent prenatal exams to check for signs of preeclampsia, if you have an increased risk for preeclampsia.  Medicine to lower your blood pressure.  Staying in the hospital, if your condition is severe. There, treatment will focus on controlling your blood pressure and the amount of fluids in your body (fluid retention).  Taking medicine (magnesium sulfate) to prevent seizures. This may be given as an injection or through an IV.  Taking a low-dose aspirin during your pregnancy.  Delivering your baby early, if your condition gets worse. You may have your labor started with medicine (induced), or you may have a cesarean delivery. Follow these instructions at home: Eating and drinking   Drink enough fluid to keep your urine  pale yellow.  Avoid caffeine. Lifestyle  Do not use any products that contain nicotine or tobacco, such as cigarettes and e-cigarettes. If you need help quitting, ask your health care provider.  Do not use alcohol or drugs.  Avoid stress as much as possible. Rest and get plenty of sleep. General instructions  Take over-the-counter and prescription medicines only as told by your health care provider.  When lying down, lie on your left side. This keeps pressure off your major blood vessels.  When sitting or lying down, raise (elevate) your feet. Try putting some pillows underneath your lower legs.  Exercise regularly. Ask your health care provider what kinds of exercise are best for you.  Keep all follow-up and prenatal visits as told by your health care provider. This is important. How is this prevented? There is no known way of preventing preeclampsia or eclampsia from  developing. However, to lower your risk of complications and detect problems early:  Get regular prenatal care. Your health care provider may be able to diagnose and treat the condition early.  Maintain a healthy weight. Ask your health care provider for help managing weight gain during pregnancy.  Work with your health care provider to manage any long-term (chronic) health conditions you have, such as diabetes or kidney problems.  You may have tests of your blood pressure and kidney function after giving birth.  Your health care provider may have you take low-dose aspirin during your next pregnancy. Contact a health care provider if:  You have symptoms that your health care provider told you may require more treatment or monitoring, such as: ? Headaches. ? Nausea or vomiting. ? Abdominal pain. ? Dizziness. ? Light-headedness. Get help right away if:  You have severe: ? Abdominal pain. ? Headaches that do not get better. ? Dizziness. ? Vision problems. ? Confusion. ? Nausea or vomiting.  You have any of the following: ? A seizure. ? Sudden, rapid weight gain. ? Sudden swelling in your hands, ankles, or face. ? Trouble moving any part of your body. ? Numbness in any part of your body. ? Trouble speaking. ? Abnormal bleeding.  You faint. Summary  Preeclampsia is a serious condition that may develop during pregnancy. It is also called toxemia of pregnancy.  This condition causes high blood pressure along with other symptoms, such as swelling and headaches.  Diagnosing and treating preeclampsia early is very important. If not treated early, it can cause serious problems for you and your baby.  Get help right away if you have symptoms that your health care provider told you to watch for. This information is not intended to replace advice given to you by your health care provider. Make sure you discuss any questions you have with your health care provider. Document  Released: 10/20/2000 Document Revised: 10/09/2017 Document Reviewed: 05/29/2016 Elsevier Interactive Patient Education  2019 [employer].  Coronavirus (COVID-19) Are you at risk?  Are you at risk for the Coronavirus (COVID-19)?  To be considered HIGH RISK for Coronavirus (COVID-19), you have to meet the following criteria:  . Traveled to [place], [place], [place], [place] or [place]; or in the [place] to [place], [place], [place], or [place]; and have fever, cough, and shortness of breath within the last 2 weeks of travel OR . Been in close contact with a person diagnosed with COVID-19 within the last 2 weeks and have fever, cough, and shortness of breath . IF YOU DO NOT MEET THESE CRITERIA, YOU ARE  CONSIDERED LOW RISK FOR COVID-19.  What to do if you are HIGH RISK for COVID-19?  Marland Kitchen If you are having a medical emergency, call 911. . Seek medical care right away. Before you go to a doctor's office, urgent care or emergency department, call ahead and tell them about your recent travel, contact with someone diagnosed with COVID-19, and your symptoms. You should receive instructions from your physician's office regarding next steps of care.  . When you arrive at healthcare provider, tell the healthcare staff immediately you have returned from visiting [place], [place], [place], [place] or [place]; or traveled in the [place] to [place], [place], [place], or [place]; in the last two weeks or you have been in close contact with a person diagnosed with COVID-19 in the last 2 weeks.   . Tell the health care staff about your symptoms: fever, cough and shortness of breath. . After you have been seen by a medical provider, you will be either: o Tested for (COVID-19) and discharged home on quarantine except to seek medical care if symptoms worsen, and asked to  - Stay home and avoid contact with others until you get your results (4-5 days)  - Avoid travel on public transportation if  possible (such as bus, train, or airplane) or o Sent to the Emergency Department by EMS for evaluation, COVID-19 testing, and possible admission depending on your condition and test results.  What to do if you are LOW RISK for COVID-19?  Reduce your risk of any infection by using the same precautions used for avoiding the common cold or flu:  Marland Kitchen Wash your hands often with soap and warm water for at least 20 seconds.  If soap and water are not readily available, use an alcohol-based hand sanitizer with at least 60% alcohol.  . If coughing or sneezing, cover your mouth and nose by coughing or sneezing into the elbow areas of your shirt or coat, into a tissue or into your sleeve (not your hands). . Avoid shaking hands with others and consider head nods or verbal greetings only. . Avoid touching your eyes, nose, or mouth with unwashed hands.  . Avoid close contact with people who are sick. . Avoid places or events with large numbers of people in one location, like concerts or sporting events. . Carefully consider travel plans you have or are making. . If you are planning any travel outside or inside the [place], visit the CDC's Travelers' Health webpage for the latest health notices. . If you have some symptoms but not all symptoms, continue to monitor at home and seek medical attention if your symptoms worsen. . If you are having a medical emergency, call 911.   ADDITIONAL HEALTHCARE OPTIONS FOR PATIENTS  [facility] / e-Visit: [email or web address]         [facility]: [phone]  [facility]: [phone]                   [facility]: [phone]

## 2019-03-05 NOTE — Progress Notes (Signed)
   TELEHEALTH VIRTUAL OBSTETRICS VISIT ENCOUNTER NOTE Patient name: Michele Bates MRN [id number]  Date of birth: 11/14/1990  I connected with patient on 03/05/19 at  2:00 PM EDT by [facility] and verified that I am speaking with the correct person using two identifiers. Due to COVID-19 recommendations, pt is not currently in our office.    I discussed the limitations, risks, security and privacy concerns of performing an evaluation and management service by telephone and the availability of in person appointments. I also discussed with the patient that there may be a patient responsible charge related to this service. The patient expressed understanding and agreed to proceed.  Chief Complaint:   Routine Prenatal Visit (aches and swelling in arms and feet)  History of Present Illness:   Michele Bates is a 28 y.o. G35P1011 female at [redacted]w[redacted]d with an Estimated Date of Delivery: 04/06/19 being evaluated today for ongoing management of a low-risk pregnancy.  Today she reports no complaints. Denies ha, visual changes, ruq/epigastric pain, n/v.  Doesn't recall if she had bp issues w/ her last pregnancy, thinks she may have. That baby was [facility] at [facility]. BP yesterday 153/69. Contractions: Not present. Vag. Bleeding: None.  Movement: Present. denies leaking of fluid. Review of Systems:   Pertinent items are noted in HPI Denies abnormal vaginal discharge w/ itching/odor/irritation, headaches, visual changes, shortness of breath, chest pain, abdominal pain, severe nausea/vomiting, or problems with urination or bowel movements unless otherwise stated above. Pertinent History Reviewed:  Reviewed past medical,surgical, social, obstetrical and family history.  Reviewed problem list, medications and allergies. Physical Assessment:   Vitals:   03/05/19 1446 03/05/19 1450  BP: (!) 128/91 140/76  Pulse: 100 99  Weight: 182 lb 3.2 oz (82.6 kg)   Body mass index is 33.32 kg/m.        Physical Examination:    General:  Alert, oriented and cooperative.   Mental Status: Normal mood and affect perceived. Normal judgment and thought content.  Rest of physical exam deferred due to type of encounter  No results found for this or any previous visit (from the past 24 hour(s)).  Assessment & Plan:  1) Pregnancy G3P1011 at [redacted]w[redacted]d with an Estimated Date of Delivery: 04/06/19   2) Elevated bp, come today for labs, f/u in person in 2d. Check bp QID, bring log to appt. Reviewed pre-e s/s, reasons to seek care.    Meds: No orders of the defined types were placed in this encounter.   Labs/procedures today: cbc, cmp, P:C ratio  Plan:  Continue routine obstetrical care. Has home BP cuff. Check bp QID  Reviewed: Preterm labor symptoms and general obstetric precautions including but not limited to vaginal bleeding, contractions, leaking of fluid and fetal movement were reviewed in detail with the patient. The patient was advised to call back or seek an in-person office evaluation/go to [facility] at [facility] for any urgent or concerning symptoms. All questions were answered. Please refer to After Visit Summary for other counseling recommendations.   I provided 15 minutes of non-face-to-face time during this encounter.  Follow-up: Return in about 2 days (around 03/07/2019) for LROB in person.  Orders Placed This Encounter  Procedures  . CBC  . Comprehensive metabolic panel  . Protein / creatinine ratio, urine   Cheral Marker CNM, [facility] 03/05/2019 3:06 PM

## 2019-03-06 ENCOUNTER — Encounter: Payer: Self-pay | Admitting: *Deleted

## 2019-03-06 LAB — COMPREHENSIVE METABOLIC PANEL
ALT: 12 IU/L (ref 0–32)
AST: 16 IU/L (ref 0–40)
Albumin/Globulin Ratio: 1.5 (ref 1.2–2.2)
Albumin: 3.4 g/dL — ABNORMAL LOW (ref 3.9–5.0)
Alkaline Phosphatase: 155 IU/L — ABNORMAL HIGH (ref 39–117)
BUN/Creatinine Ratio: 13 (ref 9–23)
BUN: 6 mg/dL (ref 6–20)
Bilirubin Total: 0.3 mg/dL (ref 0.0–1.2)
CO2: 20 mmol/L (ref 20–29)
Calcium: 8.9 mg/dL (ref 8.7–10.2)
Chloride: 103 mmol/L (ref 96–106)
Creatinine, Ser: 0.45 mg/dL — ABNORMAL LOW (ref 0.57–1.00)
GFR calc Af Amer: 160 mL/min/{1.73_m2} (ref >59)
GFR calc non Af Amer: 139 mL/min/{1.73_m2} (ref >59)
Globulin, Total: 2.2 g/dL (ref 1.5–4.5)
Glucose: 88 mg/dL (ref 65–99)
Potassium: 4 mmol/L (ref 3.5–5.2)
Sodium: 135 mmol/L (ref 134–144)
Total Protein: 5.6 g/dL — ABNORMAL LOW (ref 6.0–8.5)

## 2019-03-06 LAB — CBC
Hematocrit: 34.7 % (ref 34.0–46.6)
Hemoglobin: 11.8 g/dL (ref 11.1–15.9)
MCH: 30.8 pg (ref 26.6–33.0)
MCHC: 34 g/dL (ref 31.5–35.7)
MCV: 91 fL (ref 79–97)
Platelets: 225 10*3/uL (ref 150–450)
RBC: 3.83 x10E6/uL (ref 3.77–5.28)
RDW: 13 % (ref 11.7–15.4)
WBC: 6.7 10*3/uL (ref 3.4–10.8)

## 2019-03-06 LAB — PROTEIN / CREATININE RATIO, URINE
Creatinine, Urine: 12.3 mg/dL
Protein, Ur: <4 mg/dL

## 2019-03-07 ENCOUNTER — Ambulatory Visit (INDEPENDENT_AMBULATORY_CARE_PROVIDER_SITE_OTHER): Payer: Medicaid Other | Admitting: Women's Health

## 2019-03-07 ENCOUNTER — Encounter: Payer: Self-pay | Admitting: Women's Health

## 2019-03-07 ENCOUNTER — Other Ambulatory Visit: Payer: Self-pay

## 2019-03-07 VITALS — BP 124/80 | HR 104 | Wt 185.2 lb

## 2019-03-07 DIAGNOSIS — Z3A35 35 weeks gestation of pregnancy: Secondary | ICD-10-CM

## 2019-03-07 DIAGNOSIS — Z1389 Encounter for screening for other disorder: Secondary | ICD-10-CM

## 2019-03-07 DIAGNOSIS — Z3483 Encounter for supervision of other normal pregnancy, third trimester: Secondary | ICD-10-CM

## 2019-03-07 DIAGNOSIS — Z331 Pregnant state, incidental: Secondary | ICD-10-CM

## 2019-03-07 LAB — POCT URINALYSIS DIPSTICK OB
Blood, UA: NEGATIVE
Glucose, UA: NEGATIVE
Ketones, UA: NEGATIVE
Leukocytes, UA: NEGATIVE
Nitrite, UA: NEGATIVE
POC,PROTEIN,UA: NEGATIVE

## 2019-03-07 NOTE — Progress Notes (Signed)
   LOW-RISK PREGNANCY VISIT Patient name: Michele Bates MRN [id number]  Date of birth: 1991-09-22 Chief Complaint:   Routine Prenatal Visit (swelling hand, feet)  History of Present Illness:   Michele Bates is a 28 y.o. G84P1011 female at [redacted]w[redacted]d with an Estimated Date of Delivery: 04/06/19 being seen today for ongoing management of a low-risk pregnancy.  Today she reports some swelling hands/feet. Being seen in office today d/t elevated bp on webex visit 2d ago. Came in to get pre-e labs which are all normal. Denies ha, visual changes, ruq/epigastric pain, n/v.   Contractions: Not present.  .  Movement: Present. denies leaking of fluid. Review of Systems:   Pertinent items are noted in HPI Denies abnormal vaginal discharge w/ itching/odor/irritation, headaches, visual changes, shortness of breath, chest pain, abdominal pain, severe nausea/vomiting, or problems with urination or bowel movements unless otherwise stated above. Pertinent History Reviewed:  Reviewed past medical,surgical, social, obstetrical and family history.  Reviewed problem list, medications and allergies. Physical Assessment:   Vitals:   03/07/19 0850  BP: 124/80  Pulse: (!) 104  Weight: 185 lb 3.2 oz (84 kg)  Body mass index is 33.87 kg/m.        Physical Examination:   General appearance: Well appearing, and in no distress  Mental status: Alert, oriented to person, place, and time  Skin: Warm & dry  Cardiovascular: Normal heart rate noted  Respiratory: Normal respiratory effort, no distress  Abdomen: Soft, gravid, nontender  Pelvic: Cervical exam deferred         Extremities: Edema: Trace  Fetal Status: Fetal Heart Rate (bpm): 158 Fundal Height: 36 cm Movement: Present    Results for orders placed or performed in visit on 03/07/19 (from the past 24 hour(s))  POC Urinalysis Dipstick OB   Collection Time: 03/07/19  8:59 AM  Result Value Ref Range   Color, UA     Clarity, UA     Glucose, UA Negative  Negative   Bilirubin, UA     Ketones, UA neg    Spec Grav, UA     Blood, UA neg    pH, UA     POC,PROTEIN,UA Negative Negative, Trace, Small (1+), Moderate (2+), Large (3+), 4+   Urobilinogen, UA     Nitrite, UA neg    Leukocytes, UA Negative Negative   Appearance     Odor      Assessment & Plan:  1) Low-risk pregnancy G3P1011 at [redacted]w[redacted]d with an Estimated Date of Delivery: 04/06/19   2) Elevated bp 2d ago> normal today, neg labs, asymptomatic, continue to monitor bp at home, reviewed pre-e s/s, reasons to seek care   Meds: No orders of the defined types were placed in this encounter.  Labs/procedures today: none  Plan:  Continue routine obstetrical care   Reviewed: Preterm labor symptoms and general obstetric precautions including but not limited to vaginal bleeding, contractions, leaking of fluid and fetal movement were reviewed in detail with the patient.  All questions were answered  Follow-up: Return in about 1 week (around 03/14/2019) for LROB in person, gbs.  Orders Placed This Encounter  Procedures  . POC Urinalysis Dipstick OB   Cheral Marker CNM, [facility] 03/07/2019 9:35 AM

## 2019-03-07 NOTE — Patient Instructions (Signed)
Michele Bates, I greatly value your feedback.  If you receive a survey following your visit with [place] today, we appreciate you taking the time to fill it out.  Thanks, [name], CNM, [facility]  [facility] HAS MOVED!!! It is now [facility] at [facility] ([address] [place], [place] [address]) Entrance located off of [address] Free 24/7 valet parking    Call the office [phone]) or go to [facility] if:  You begin to have strong, frequent contractions  Your water breaks.  Sometimes it is a big gush of fluid, sometimes it is just a trickle that keeps getting your panties wet or running down your legs  You have vaginal bleeding.  It is normal to have a small amount of spotting if your cervix was checked.   You don't feel your baby moving like normal.  If you don't, get you something to eat and drink and lay down and focus on feeling your baby move.  You should feel at least 10 movements in 2 hours.  If you don't, you should call the office or go to [facility]    Call the office [phone]) or go to [facility] hospital for these signs of pre-eclampsia:  Severe headache that does not go away with Tylenol  Visual changes- seeing spots, double, blurred vision  Pain under your right breast or upper abdomen that does not go away with Tums or heartburn medicine  Nausea and/or vomiting  Severe swelling in your hands, feet, and face   Home Blood Pressure Monitoring for Patients   Your provider has recommended that you check your blood pressure (BP) at least once a week at home. If you do not have a blood pressure cuff at home, one will be provided for you. Contact your provider if you have not received your monitor within 1 week.   Helpful Tips for Accurate Home Blood Pressure Checks  . Don't smoke, exercise, or drink caffeine 30 minutes before checking your BP . Use the restroom before checking your BP (a full bladder can raise your pressure) . Relax  in a comfortable upright chair . Feet on the ground . Left arm resting comfortably on a flat surface at the level of your heart . Legs uncrossed . Back supported . Sit quietly and don't talk . Place the cuff on your bare arm . Adjust snuggly, so that only two fingertips can fit between your skin and the top of the cuff . Check 2 readings separated by at least one minute . Keep a log of your BP readings . For a visual, please reference this diagram: http://ccnc.care/bpdiagram  Provider Name: [facility]     Phone: [phone]  Zone 1: ALL CLEAR  Continue to monitor your symptoms:  . BP reading is less than 140 (top number) or less than 90 (bottom number)  . No right upper stomach pain . No headaches or seeing spots . No feeling nauseated or throwing up . No swelling in face and hands  Zone 2: CAUTION Call your doctor's office for any of the following:  . BP reading is greater than 140 (top number) or greater than 90 (bottom number)  . Stomach pain under your ribs in the middle or right side . Headaches or seeing spots . Feeling nauseated or throwing up . Swelling in face and hands  Zone 3: EMERGENCY  Seek immediate medical care if you have any of the following:  . BP reading  is greater than160 (top number) or greater than 110 (bottom number) . Severe headaches not improving with Tylenol . Serious difficulty catching your breath . Any worsening symptoms from Zone 2     Preeclampsia and Eclampsia  Preeclampsia is a serious condition that may develop during pregnancy. It is also called toxemia of pregnancy. This condition causes high blood pressure along with other symptoms, such as swelling and headaches. These symptoms may develop as the condition gets worse. Preeclampsia may occur at 20 weeks of pregnancy or later. Diagnosing and treating preeclampsia early is very important. If not treated early, it can cause serious problems for you and your baby. One problem it can  lead to is eclampsia. Eclampsia is a condition that causes muscle jerking or shaking (convulsions or seizures) and other serious problems for the mother. During pregnancy, delivering your baby may be the best treatment for preeclampsia or eclampsia. For most women, preeclampsia and eclampsia symptoms go away after giving birth. In rare cases, a woman may develop preeclampsia after giving birth (postpartum preeclampsia). This usually occurs within 48 hours after childbirth but may occur up to 6 weeks after giving birth. What are the causes? The cause of preeclampsia is not known. What increases the risk? The following risk factors make you more likely to develop preeclampsia:  Being pregnant for the first time.  Having had preeclampsia during a past pregnancy.  Having a family history of preeclampsia.  Having high blood pressure.  Being pregnant with more than one baby.  Being 3535 or older.  Being African-American.  Having kidney disease or diabetes.  Having medical conditions such as lupus or blood diseases.  Being very overweight (obese). What are the signs or symptoms? The earliest signs of preeclampsia are:  High blood pressure.  Increased protein in your urine. Your health care provider will check for this at every visit before you give birth (prenatal visit). Other symptoms that may develop as the condition gets worse include:  Severe headaches.  Sudden weight gain.  Swelling of the hands, face, legs, and feet.  Nausea and vomiting.  Vision problems, such as blurred or double vision.  Numbness in the face, arms, legs, and feet.  Urinating less than usual.  Dizziness.  Slurred speech.  Abdominal pain, especially upper abdominal pain.  Convulsions or seizures. How is this diagnosed? There are no screening tests for preeclampsia. Your health care provider will ask you about symptoms and check for signs of preeclampsia during your prenatal visits. You may also  have tests that include:  Urine tests.  Blood tests.  Checking your blood pressure.  Monitoring your baby's heart rate.  Ultrasound. How is this treated? You and your health care provider will determine the treatment approach that is best for you. Treatment may include:  Having more frequent prenatal exams to check for signs of preeclampsia, if you have an increased risk for preeclampsia.  Medicine to lower your blood pressure.  Staying in the hospital, if your condition is severe. There, treatment will focus on controlling your blood pressure and the amount of fluids in your body (fluid retention).  Taking medicine (magnesium sulfate) to prevent seizures. This may be given as an injection or through an IV.  Taking a low-dose aspirin during your pregnancy.  Delivering your baby early, if your condition gets worse. You may have your labor started with medicine (induced), or you may have a cesarean delivery. Follow these instructions at home: Eating and drinking   Drink enough fluid to  keep your urine pale yellow.  Avoid caffeine. Lifestyle  Do not use any products that contain nicotine or tobacco, such as cigarettes and e-cigarettes. If you need help quitting, ask your health care provider.  Do not use alcohol or drugs.  Avoid stress as much as possible. Rest and get plenty of sleep. General instructions  Take over-the-counter and prescription medicines only as told by your health care provider.  When lying down, lie on your left side. This keeps pressure off your major blood vessels.  When sitting or lying down, raise (elevate) your feet. Try putting some pillows underneath your lower legs.  Exercise regularly. Ask your health care provider what kinds of exercise are best for you.  Keep all follow-up and prenatal visits as told by your health care provider. This is important. How is this prevented? There is no known way of preventing preeclampsia or eclampsia from  developing. However, to lower your risk of complications and detect problems early:  Get regular prenatal care. Your health care provider may be able to diagnose and treat the condition early.  Maintain a healthy weight. Ask your health care provider for help managing weight gain during pregnancy.  Work with your health care provider to manage any long-term (chronic) health conditions you have, such as diabetes or kidney problems.  You may have tests of your blood pressure and kidney function after giving birth.  Your health care provider may have you take low-dose aspirin during your next pregnancy. Contact a health care provider if:  You have symptoms that your health care provider told you may require more treatment or monitoring, such as: ? Headaches. ? Nausea or vomiting. ? Abdominal pain. ? Dizziness. ? Light-headedness. Get help right away if:  You have severe: ? Abdominal pain. ? Headaches that do not get better. ? Dizziness. ? Vision problems. ? Confusion. ? Nausea or vomiting.  You have any of the following: ? A seizure. ? Sudden, rapid weight gain. ? Sudden swelling in your hands, ankles, or face. ? Trouble moving any part of your body. ? Numbness in any part of your body. ? Trouble speaking. ? Abnormal bleeding.  You faint. Summary  Preeclampsia is a serious condition that may develop during pregnancy. It is also called toxemia of pregnancy.  This condition causes high blood pressure along with other symptoms, such as swelling and headaches.  Diagnosing and treating preeclampsia early is very important. If not treated early, it can cause serious problems for you and your baby.  Get help right away if you have symptoms that your health care provider told you to watch for. This information is not intended to replace advice given to you by your health care provider. Make sure you discuss any questions you have with your health care provider. Document  Released: 10/20/2000 Document Revised: 10/09/2017 Document Reviewed: 05/29/2016 Elsevier Interactive Patient Education  2019 [employer].

## 2019-03-13 ENCOUNTER — Encounter: Payer: Self-pay | Admitting: *Deleted

## 2019-03-14 ENCOUNTER — Encounter: Payer: Self-pay | Admitting: Obstetrics & Gynecology

## 2019-03-14 ENCOUNTER — Other Ambulatory Visit: Payer: Self-pay

## 2019-03-14 ENCOUNTER — Ambulatory Visit (INDEPENDENT_AMBULATORY_CARE_PROVIDER_SITE_OTHER): Payer: Medicaid Other | Admitting: Obstetrics & Gynecology

## 2019-03-14 VITALS — BP 126/75 | HR 108 | Temp 98.4°F | Wt 186.2 lb

## 2019-03-14 DIAGNOSIS — Z3A36 36 weeks gestation of pregnancy: Secondary | ICD-10-CM

## 2019-03-14 DIAGNOSIS — Z1389 Encounter for screening for other disorder: Secondary | ICD-10-CM

## 2019-03-14 DIAGNOSIS — Z3483 Encounter for supervision of other normal pregnancy, third trimester: Secondary | ICD-10-CM

## 2019-03-14 DIAGNOSIS — Z331 Pregnant state, incidental: Secondary | ICD-10-CM

## 2019-03-14 LAB — POCT URINALYSIS DIPSTICK OB
Blood, UA: NEGATIVE
Glucose, UA: NEGATIVE
Leukocytes, UA: NEGATIVE
Nitrite, UA: NEGATIVE

## 2019-03-14 NOTE — Progress Notes (Signed)
   LOW-RISK PREGNANCY VISIT Patient name: Michele Bates MRN [id number]  Date of birth: 01-23-1991 Chief Complaint:   Routine Prenatal Visit (gbs-gc-chl)  History of Present Illness:   Michele Bates is a 28 y.o. G45P1011 female at [redacted]w[redacted]d with an Estimated Date of Delivery: 04/06/19 being seen today for ongoing management of a low-risk pregnancy.  Today she reports no complaints. Contractions: Not present. Vag. Bleeding: None.  Movement: Present. denies leaking of fluid. Review of Systems:   Pertinent items are noted in HPI Denies abnormal vaginal discharge w/ itching/odor/irritation, headaches, visual changes, shortness of breath, chest pain, abdominal pain, severe nausea/vomiting, or problems with urination or bowel movements unless otherwise stated above. Pertinent History Reviewed:  Reviewed past medical,surgical, social, obstetrical and family history.  Reviewed problem list, medications and allergies. Physical Assessment:   Vitals:   03/14/19 1050  BP: 126/75  Pulse: (!) 108  Temp: 98.4 F (36.9 C)  Weight: 186 lb 3.2 oz (84.5 kg)  Body mass index is 34.06 kg/m.        Physical Examination:   General appearance: Well appearing, and in no distress  Mental status: Alert, oriented to person, place, and time  Skin: Warm & dry  Cardiovascular: Normal heart rate noted  Respiratory: Normal respiratory effort, no distress  Abdomen: Soft, gravid, nontender  Pelvic: Cervical exam performed  Dilation: 1.5 Effacement (%): Thick Station: -3  Extremities: Edema: Trace  Fetal Status: Fetal Heart Rate (bpm): 155 Fundal Height: 36 cm Movement: Present Presentation: Vertex  Results for orders placed or performed in visit on 03/14/19 (from the past 24 hour(s))  POC Urinalysis Dipstick OB   Collection Time: 03/14/19 10:58 AM  Result Value Ref Range   Color, UA     Clarity, UA     Glucose, UA Negative Negative   Bilirubin, UA     Ketones, UA small    Spec Grav, UA     Blood, UA  neg    pH, UA     POC,PROTEIN,UA Trace Negative, Trace, Small (1+), Moderate (2+), Large (3+), 4+   Urobilinogen, UA     Nitrite, UA neg    Leukocytes, UA Negative Negative   Appearance     Odor      Assessment & Plan:  1) Low-risk pregnancy G3P1011 at [redacted]w[redacted]d with an Estimated Date of Delivery: 04/06/19      Meds: No orders of the defined types were placed in this encounter.  Labs/procedures today: cultures done  Plan:  Continue routine obstetrical care   Reviewed: Term labor symptoms and general obstetric precautions including but not limited to vaginal bleeding, contractions, leaking of fluid and fetal movement were reviewed in detail with the patient.  All questions were answered  Follow-up: Return in about 1 week (around 03/21/2019) for 1 week webex, 2 weeks in office.  Orders Placed This Encounter  Procedures  . Strep Gp B NAA  . GC/Chlamydia Probe Amp  . POC Urinalysis Dipstick OB   Amaryllis Dyke   03/14/2019 11:24 AM

## 2019-03-16 LAB — STREP GP B NAA: Strep Gp B NAA: NEGATIVE

## 2019-03-19 LAB — GC/CHLAMYDIA PROBE AMP
Chlamydia trachomatis, NAA: NEGATIVE
Neisseria Gonorrhoeae by PCR: NEGATIVE

## 2019-03-21 ENCOUNTER — Ambulatory Visit (INDEPENDENT_AMBULATORY_CARE_PROVIDER_SITE_OTHER): Payer: Medicaid Other | Admitting: Women's Health

## 2019-03-21 ENCOUNTER — Encounter: Payer: Self-pay | Admitting: Women's Health

## 2019-03-21 ENCOUNTER — Other Ambulatory Visit: Payer: Self-pay

## 2019-03-21 VITALS — BP 126/82 | HR 102 | Wt 183.0 lb

## 2019-03-21 DIAGNOSIS — Z331 Pregnant state, incidental: Secondary | ICD-10-CM

## 2019-03-21 DIAGNOSIS — Z3483 Encounter for supervision of other normal pregnancy, third trimester: Secondary | ICD-10-CM

## 2019-03-21 DIAGNOSIS — Z1389 Encounter for screening for other disorder: Secondary | ICD-10-CM

## 2019-03-21 LAB — POCT URINALYSIS DIPSTICK OB
Blood, UA: NEGATIVE
Glucose, UA: NEGATIVE
Leukocytes, UA: NEGATIVE
Nitrite, UA: NEGATIVE
POC,PROTEIN,UA: NEGATIVE

## 2019-03-21 NOTE — Patient Instructions (Signed)
Michele Bates, I greatly value your feedback.  If you receive a survey following your visit with [place] today, we appreciate you taking the time to fill it out.  Thanks, [name], CNM, [facility]  [facility] HAS MOVED!!! It is now [facility] ([address] [place], [place] [address]) Entrance located off of [address] Free 24/7 valet parking    Call the office [phone]) or go to [facility] if:  You begin to have strong, frequent contractions  Your water breaks.  Sometimes it is a big gush of fluid, sometimes it is just a trickle that keeps getting your panties wet or running down your legs  You have vaginal bleeding.  It is normal to have a small amount of spotting if your cervix was checked.   You don't feel your baby moving like normal.  If you don't, get you something to eat and drink and lay down and focus on feeling your baby move.  You should feel at least 10 movements in 2 hours.  If you don't, you should call the office or go to [facility].    Call the office [phone]) or go to [facility] hospital for these signs of pre-eclampsia:  Severe headache that does not go away with Tylenol  Visual changes- seeing spots, double, blurred vision  Pain under your right breast or upper abdomen that does not go away with Tums or heartburn medicine  Nausea and/or vomiting  Severe swelling in your hands, feet, and face     Braxton Hicks Contractions Contractions of the uterus can occur throughout pregnancy, but they are not always a sign that you are in labor. You may have practice contractions called Braxton Hicks contractions. These false labor contractions are sometimes confused with true labor. What are Deberah Pelton contractions? Braxton Hicks contractions are tightening movements that occur in the muscles of the uterus before labor. Unlike true labor contractions, these contractions do not result in opening (dilation) and thinning of  the cervix. Toward the end of pregnancy (32-34 weeks), Braxton Hicks contractions can happen more often and may become stronger. These contractions are sometimes difficult to tell apart from true labor because they can be very uncomfortable. You should not feel embarrassed if you go to the hospital with false labor. Sometimes, the only way to tell if you are in true labor is for your health care provider to look for changes in the cervix. The health care provider will do a physical exam and may monitor your contractions. If you are not in true labor, the exam should show that your cervix is not dilating and your water has not broken. If there are no other health problems associated with your pregnancy, it is completely safe for you to be sent home with false labor. You may continue to have Braxton Hicks contractions until you go into true labor. How to tell the difference between true labor and false labor True labor  Contractions last 30-70 seconds.  Contractions become very regular.  Discomfort is usually felt in the top of the uterus, and it spreads to the lower abdomen and low back.  Contractions do not go away with walking.  Contractions usually become more intense and increase in frequency.  The cervix dilates and gets thinner. False labor  Contractions are usually shorter and not as strong as true labor contractions.  Contractions are usually irregular.  Contractions are often felt in the front of the lower abdomen and  in the groin.  Contractions may go away when you walk around or change positions while lying down.  Contractions get weaker and are shorter-lasting as time goes on.  The cervix usually does not dilate or become thin. Follow these instructions at home:   Take over-the-counter and prescription medicines only as told by your health care provider.  Keep up with your usual exercises and follow other instructions from your health care provider.  Eat and drink  lightly if you think you are going into labor.  If Braxton Hicks contractions are making you uncomfortable: ? Change your position from lying down or resting to walking, or change from walking to resting. ? Sit and rest in a tub of warm water. ? Drink enough fluid to keep your urine pale yellow. Dehydration may cause these contractions. ? Do slow and deep breathing several times an hour.  Keep all follow-up prenatal visits as told by your health care provider. This is important. Contact a health care provider if:  You have a fever.  You have continuous pain in your abdomen. Get help right away if:  Your contractions become stronger, more regular, and closer together.  You have fluid leaking or gushing from your vagina.  You pass blood-tinged mucus (bloody show).  You have bleeding from your vagina.  You have low back pain that you never had before.  You feel your babys head pushing down and causing pelvic pressure.  Your baby is not moving inside you as much as it used to. Summary  Contractions that occur before labor are called Braxton Hicks contractions, false labor, or practice contractions.  Braxton Hicks contractions are usually shorter, weaker, farther apart, and less regular than true labor contractions. True labor contractions usually become progressively stronger and regular, and they become more frequent.  Manage discomfort from [facility] contractions by changing position, resting in a warm bath, drinking plenty of water, or practicing deep breathing. This information is not intended to replace advice given to you by your health care provider. Make sure you discuss any questions you have with your health care provider. Document Released: 03/08/2017 Document Revised: 08/07/2017 Document Reviewed: 03/08/2017 Elsevier Interactive Patient Education  2019 [employer]

## 2019-03-21 NOTE — Progress Notes (Signed)
TELEHEALTH VIRTUAL OBSTETRICS VISIT ENCOUNTER NOTE Patient name: Michele Bates MRN [id number]  Date of birth: 11/14/1990  I connected with patient on 03/21/19 at 10:30 AM EDT by [facility] and verified that I am speaking with the correct person using two identifiers. Due to COVID-19 recommendations, pt is not currently in our office.    I discussed the limitations, risks, security and privacy concerns of performing an evaluation and management service by telephone and the availability of in person appointments. I also discussed with the patient that there may be a patient responsible charge related to this service. The patient expressed understanding and agreed to proceed.  Chief Complaint:   Routine Prenatal Visit  History of Present Illness:   Michele Bates is a 28 y.o. G45P1011 female at [redacted]w[redacted]d with an Estimated Date of Delivery: 04/06/19 being evaluated today for ongoing management of a low-risk pregnancy.  Today she reports mild headache last night, resolved on its own. Denies visual changes, ruq/epigastric pain, n/v.   Contractions: Irregular.  .  Movement: Present. denies leaking of fluid. Review of Systems:   Pertinent items are noted in HPI Denies abnormal vaginal discharge w/ itching/odor/irritation, headaches, visual changes, shortness of breath, chest pain, abdominal pain, severe nausea/vomiting, or problems with urination or bowel movements unless otherwise stated above. Pertinent History Reviewed:  Reviewed past medical,surgical, social, obstetrical and family history.  Reviewed problem list, medications and allergies. Physical Assessment:   Vitals:   03/21/19 1023 03/21/19 1057 03/21/19 1423  BP: (!) 149/102 118/77 126/82  Pulse: (!) 102    Weight: 183 lb (83 kg)    Body mass index is 33.47 kg/m.        Physical Examination:   General:  Alert, oriented and cooperative.   Mental Status: Normal mood and affect perceived. Normal judgment and thought content.  Rest of  physical exam deferred due to type of encounter  Results for orders placed or performed in visit on 03/21/19 (from the past 24 hour(s))  POC Urinalysis Dipstick OB   Collection Time: 03/21/19  2:24 PM  Result Value Ref Range   Color, UA     Clarity, UA     Glucose, UA Negative Negative   Bilirubin, UA     Ketones, UA small    Spec Grav, UA     Blood, UA neg    pH, UA     POC,PROTEIN,UA Negative Negative, Trace, Small (1+), Moderate (2+), Large (3+), 4+   Urobilinogen, UA     Nitrite, UA neg    Leukocytes, UA Negative Negative   Appearance     Odor      Assessment & Plan:  1) Pregnancy G3P1011 at [redacted]w[redacted]d with an Estimated Date of Delivery: 04/06/19   2) Initially elevated bp, completely normal on recheck, had elevated bp at 35wk webex visit, normal when she came for bp check 2d later w/ nurse, normal last week in office visit, ? Cuff discrepancy. To come to office today for bp check w/ nurse to confirm.  Asymptomatic.    Meds: No orders of the defined types were placed in this encounter.   Labs/procedures today: none  Plan:  Continue routine obstetrical care. Has BP cuff. Check bp weekly, let us know if >140/90.   Reviewed: Term labor symptoms and general obstetric precautions including but not limited to vaginal bleeding, contractions, leaking of fluid and fetal movement were reviewed in detail with the patient. The patient was advised to call back or seek an in-person  office evaluation/[facility] for any urgent or concerning symptoms. All questions were answered. Please refer to After Visit Summary for other counseling recommendations.   I provided 15 minutes of non-face-to-face time during this encounter.  Follow-up: Return for come today for bp check w/ nurse.  Orders Placed This Encounter  Procedures  . POC Urinalysis Dipstick OB   Cheral MarkerKimberly R Jahlani Lorentz CNM, [facility] 03/21/2019 2:28 PM    BP check here w/ nurse: 126/82, sm ketones only on urine  dipstick- so do not feel she has HTN disorder of pregnancy as all of her in-office bp's are completely normal, more likely her home bp cuff is off. F/U 1wk in office

## 2019-03-22 ENCOUNTER — Other Ambulatory Visit: Payer: Self-pay

## 2019-03-22 ENCOUNTER — Encounter (HOSPITAL_COMMUNITY): Payer: Self-pay

## 2019-03-22 ENCOUNTER — Inpatient Hospital Stay (HOSPITAL_COMMUNITY)
Admission: AD | Admit: 2019-03-22 | Discharge: 2019-03-24 | DRG: 807 | Disposition: A | Discharge status: Home / Self Care | Payer: Medicaid Other | Attending: Obstetrics and Gynecology | Admitting: Obstetrics and Gynecology

## 2019-03-22 ENCOUNTER — Inpatient Hospital Stay (HOSPITAL_COMMUNITY): Payer: Medicaid Other | Admitting: Anesthesiology

## 2019-03-22 DIAGNOSIS — Z3483 Encounter for supervision of other normal pregnancy, third trimester: Secondary | ICD-10-CM

## 2019-03-22 DIAGNOSIS — Z3A38 38 weeks gestation of pregnancy: Secondary | ICD-10-CM | POA: Diagnosis not present

## 2019-03-22 DIAGNOSIS — Z3A37 37 weeks gestation of pregnancy: Secondary | ICD-10-CM

## 2019-03-22 DIAGNOSIS — Z1159 Encounter for screening for other viral diseases: Secondary | ICD-10-CM | POA: Diagnosis not present

## 2019-03-22 DIAGNOSIS — O134 Gestational [pregnancy-induced] hypertension without significant proteinuria, complicating childbirth: Secondary | ICD-10-CM | POA: Diagnosis not present

## 2019-03-22 DIAGNOSIS — O133 Gestational [pregnancy-induced] hypertension without significant proteinuria, third trimester: Secondary | ICD-10-CM

## 2019-03-22 LAB — CBC
HCT: 38 % (ref 36.0–46.0)
Hemoglobin: 12.8 g/dL (ref 12.0–15.0)
MCH: 31.4 pg (ref 26.0–34.0)
MCHC: 33.7 g/dL (ref 30.0–36.0)
MCV: 93.4 fL (ref 80.0–100.0)
Platelets: 215 10*3/uL (ref 150–400)
RBC: 4.07 MIL/uL (ref 3.87–5.11)
RDW: 13.3 % (ref 11.5–15.5)
WBC: 7.5 10*3/uL (ref 4.0–10.5)
nRBC: 0 % (ref 0.0–0.2)

## 2019-03-22 LAB — COMPREHENSIVE METABOLIC PANEL
ALT: 16 U/L (ref 0–44)
AST: 19 U/L (ref 15–41)
Albumin: 3.1 g/dL — ABNORMAL LOW (ref 3.5–5.0)
Alkaline Phosphatase: 212 U/L — ABNORMAL HIGH (ref 38–126)
Anion gap: 9 (ref 5–15)
BUN: <5 mg/dL — ABNORMAL LOW (ref 6–20)
CO2: 18 mmol/L — ABNORMAL LOW (ref 22–32)
Calcium: 9.2 mg/dL (ref 8.9–10.3)
Chloride: 107 mmol/L (ref 98–111)
Creatinine, Ser: 0.44 mg/dL (ref 0.44–1.00)
GFR calc Af Amer: >60 mL/min (ref >60)
GFR calc non Af Amer: >60 mL/min (ref >60)
Glucose, Bld: 80 mg/dL (ref 70–99)
Potassium: 3.6 mmol/L (ref 3.5–5.1)
Sodium: 134 mmol/L — ABNORMAL LOW (ref 135–145)
Total Bilirubin: 0.9 mg/dL (ref 0.3–1.2)
Total Protein: 6.2 g/dL — ABNORMAL LOW (ref 6.5–8.1)

## 2019-03-22 LAB — PROTEIN / CREATININE RATIO, URINE
Creatinine, Urine: 16.01 mg/dL
Total Protein, Urine: <6 mg/dL

## 2019-03-22 LAB — SARS CORONAVIRUS 2 BY RT PCR (HOSPITAL ORDER, PERFORMED IN ~~LOC~~ HOSPITAL LAB): SARS Coronavirus 2: NEGATIVE

## 2019-03-22 LAB — TYPE AND SCREEN
ABO/RH(D): O POS
Antibody Screen: NEGATIVE

## 2019-03-22 MED ORDER — FENTANYL-BUPIVACAINE-NACL 0.5-0.125-0.9 MG/250ML-% EP SOLN: 12.0000 mL/h | EPIDURAL | Status: DC | PRN

## 2019-03-22 MED ORDER — LACTATED RINGERS IV SOLN
INTRAVENOUS | Status: DC
  Administered 2019-03-22 – 2019-03-23 (×3): via INTRAVENOUS

## 2019-03-22 MED ORDER — LIDOCAINE HCL (PF) 1 % IJ SOLN: 30.0000 mL | INTRAMUSCULAR | Status: DC | PRN

## 2019-03-22 MED ORDER — OXYTOCIN 40 UNITS IN NORMAL SALINE INFUSION - SIMPLE MED
1.0000 m[IU]/min | INTRAVENOUS | Status: DC
  Administered 2019-03-22: 23:00:00 2 m[IU]/min via INTRAVENOUS

## 2019-03-22 MED ORDER — LACTATED RINGERS IV SOLN
500.0000 mL | Freq: Once | INTRAVENOUS | Status: AC
  Administered 2019-03-22: 500 mL via INTRAVENOUS

## 2019-03-22 MED ORDER — EPHEDRINE 5 MG/ML INJ: 10.0000 mg | INTRAVENOUS | Status: DC | PRN

## 2019-03-22 MED ORDER — SOD CITRATE-CITRIC ACID 500-334 MG/5ML PO SOLN: 30.0000 mL | ORAL | Status: DC | PRN

## 2019-03-22 MED ORDER — ONDANSETRON HCL 4 MG/2ML IJ SOLN: 4.0000 mg | Freq: Four times a day (QID) | INTRAMUSCULAR | Status: DC | PRN

## 2019-03-22 MED ORDER — ACETAMINOPHEN 500 MG PO TABS
1000.0000 mg | ORAL_TABLET | Freq: Once | ORAL | Status: AC
  Administered 2019-03-22: 15:00:00 1000 mg via ORAL
  Filled 2019-03-22: qty 2

## 2019-03-22 MED ORDER — SODIUM CHLORIDE (PF) 0.9 % IJ SOLN
INTRAMUSCULAR | Status: DC | PRN
  Administered 2019-03-22: 12 mL/h via EPIDURAL

## 2019-03-22 MED ORDER — LACTATED RINGERS IV SOLN: 500.0000 mL | INTRAVENOUS | Status: DC | PRN

## 2019-03-22 MED ORDER — OXYCODONE-ACETAMINOPHEN 5-325 MG PO TABS: 2.0000 | ORAL_TABLET | ORAL | Status: DC | PRN

## 2019-03-22 MED ORDER — PHENYLEPHRINE 40 MCG/ML (10ML) SYRINGE FOR IV PUSH (FOR BLOOD PRESSURE SUPPORT)
80.0000 ug | PREFILLED_SYRINGE | INTRAVENOUS | Status: DC | PRN
  Filled 2019-03-22: qty 10

## 2019-03-22 MED ORDER — OXYTOCIN 40 UNITS IN NORMAL SALINE INFUSION - SIMPLE MED
2.5000 [IU]/h | INTRAVENOUS | Status: DC
  Administered 2019-03-23: 02:00:00 2.5 [IU]/h via INTRAVENOUS
  Filled 2019-03-22: qty 1000

## 2019-03-22 MED ORDER — TERBUTALINE SULFATE 1 MG/ML IJ SOLN: 0.2500 mg | Freq: Once | INTRAMUSCULAR | Status: DC | PRN

## 2019-03-22 MED ORDER — DIPHENHYDRAMINE HCL 50 MG/ML IJ SOLN: 12.5000 mg | INTRAMUSCULAR | Status: DC | PRN

## 2019-03-22 MED ORDER — MISOPROSTOL 50MCG HALF TABLET
50.0000 ug | ORAL_TABLET | ORAL | Status: DC | PRN
  Administered 2019-03-22: 18:00:00 50 ug via BUCCAL
  Filled 2019-03-22: qty 1

## 2019-03-22 MED ORDER — PHENYLEPHRINE 40 MCG/ML (10ML) SYRINGE FOR IV PUSH (FOR BLOOD PRESSURE SUPPORT): 80.0000 ug | PREFILLED_SYRINGE | INTRAVENOUS | Status: DC | PRN

## 2019-03-22 MED ORDER — MISOPROSTOL 50MCG HALF TABLET: 50.0000 ug | ORAL_TABLET | ORAL | Status: DC

## 2019-03-22 MED ORDER — FENTANYL-BUPIVACAINE-NACL 0.5-0.125-0.9 MG/250ML-% EP SOLN
12.0000 mL/h | EPIDURAL | Status: DC | PRN
  Filled 2019-03-22: qty 250

## 2019-03-22 MED ORDER — LIDOCAINE HCL (PF) 1 % IJ SOLN
INTRAMUSCULAR | Status: DC | PRN
  Administered 2019-03-22: 6 mL via EPIDURAL

## 2019-03-22 MED ORDER — OXYCODONE-ACETAMINOPHEN 5-325 MG PO TABS: 1.0000 | ORAL_TABLET | ORAL | Status: DC | PRN

## 2019-03-22 MED ORDER — OXYTOCIN BOLUS FROM INFUSION
500.0000 mL | Freq: Once | INTRAVENOUS | Status: AC
  Administered 2019-03-23: 02:00:00 500 mL via INTRAVENOUS

## 2019-03-22 MED ORDER — ACETAMINOPHEN 325 MG PO TABS: 650.0000 mg | ORAL_TABLET | ORAL | Status: DC | PRN

## 2019-03-22 NOTE — Anesthesia Preprocedure Evaluation (Signed)
Anesthesia Evaluation  Patient identified by MRN, date of birth, ID band Patient awake    Reviewed: Allergy & Precautions, H&P , NPO status , Patient's Chart, lab work & pertinent test results, reviewed documented beta blocker date and time   Airway Mallampati: II  TM Distance: >3 FB Neck ROM: full    Dental no notable dental hx.    Pulmonary neg pulmonary ROS,    Pulmonary exam normal breath sounds clear to auscultation       Cardiovascular negative cardio ROS Normal cardiovascular exam Rhythm:regular Rate:Normal     Neuro/Psych negative neurological ROS  negative psych ROS   GI/Hepatic negative GI ROS, Neg liver ROS,   Endo/Other  negative endocrine ROS  Renal/GU negative Renal ROS  negative genitourinary   Musculoskeletal   Abdominal   Peds  Hematology negative hematology ROS (+)   Anesthesia Other Findings   Reproductive/Obstetrics (+) Pregnancy                             Anesthesia Physical Anesthesia Plan  ASA: II  Anesthesia Plan: Epidural   Post-op Pain Management:    Induction:   PONV Risk Score and Plan:   Airway Management Planned:   Additional Equipment:   Intra-op Plan:   Post-operative Plan:   Informed Consent: I have reviewed the patients History and Physical, chart, labs and discussed the procedure including the risks, benefits and alternatives for the proposed anesthesia with the patient or authorized representative who has indicated his/her understanding and acceptance.     Dental Advisory Given  Plan Discussed with: Anesthesiologist  Anesthesia Plan Comments: (Labs checked- platelets confirmed with RN in room. Fetal heart tracing, per RN, reported to be stable enough for sitting procedure. Discussed epidural, and patient consents to the procedure:  included risk of possible headache,backache, failed block, allergic reaction, and nerve injury. This  patient was asked if she had any questions or concerns before the procedure started.)        Anesthesia Quick Evaluation  

## 2019-03-22 NOTE — Progress Notes (Signed)
OB/GYN Faculty Practice: Labor Progress Note  Subjective: Doing well. Starting to feel more comfortable with contractions. Plan of care discussed with RN - FB out around 9pm.   Objective: BP 123/72 (BP Location: Right Arm)   Pulse 86   Temp 98.4 F (36.9 C) (Oral)   Resp 20   Ht 5\' 2"  (1.575 m)   Wt 83.2 kg   LMP 06/14/2018   SpO2 99%   BMI 33.56 kg/m  Gen: well-appearing, NAD, sitting in lounge chair Dilation: 4.5 Effacement (%): 50 Cervical Position: Posterior Station: -3 Presentation: Vertex Exam by:: Rogers RN  Assessment and Plan: 28 y.o. [id number] [redacted]w[redacted]d here with early labor as well as gestational hypertension.   Labor: Given gHTN, decision made to induce/augment early labor. FB placed and cytotec given around 1800. SROM with FB placement. FB now out, starting to have more regular contractions. Discussed starting pitocin to augment/strengthen contractions and patient in agreement.  -- pain control: planning for epidural -- PPH Risk: low  Fetal Well-Being: Cephalic by sutures on RN check.  -- Category I - continuous fetal monitoring  -- GBS negative   GHTN:  BP in normal range since arrival. Has history of elevated BP at home but then improve on recheck, has also had elevated BP in clinic. UPC undetectable, labs wnl. -- continue to monitor closely   Michele Cornforth S. Earlene Plater, DO OB/GYN Fellow, Faculty Practice  10:27 PM

## 2019-03-22 NOTE — Plan of Care (Signed)
POC reviewed with pt, pt agrees and verbalized understanding, safety mnaintained

## 2019-03-22 NOTE — MAU Note (Signed)
BS ultrasound performed at 1418 to confirm presentation.  Vertex presentation confirmed.

## 2019-03-22 NOTE — H&P (Signed)
Michele Bates is a 28 y.o. female presenting for SOL and Gestational Hypertension at term. Patient is normotensive but reports recurrent headache on admission. She receives prenatal care at [facility]. Her dating is based on 8 wk [place] and she initiated prenatal care in first trimester. At admission she denies vaginal bleeding, leaking of fluid, decreased fetal movement, fever, falls, or recent illness.    History of Term SVD in 2012. Proven pelvis to 3317g.  OB History    Gravida  3   Para  1   Term  1   Preterm      AB  1   Living  1     SAB  1   TAB      Ectopic      Multiple      Live Births  1          Past Medical History:  Diagnosis Date  . Chronic pelvic pain in female    Past Surgical History:  Procedure Laterality Date  . TUMOR REMOVAL     head   Family History: family history includes Diabetes in her father, maternal grandfather, and maternal grandmother; Heart disease in her maternal grandfather and paternal grandfather; Kidney disease in her maternal grandmother. Social History:  reports that she has never smoked. She has never used smokeless tobacco. She reports that she does not drink alcohol or use drugs.     Maternal Diabetes: No Genetic Screening: Declined Maternal Ultrasounds/Referrals: Normal Fetal Ultrasounds or other Referrals:  None Maternal Substance Abuse:  No Significant Maternal Medications:  None Significant Maternal Lab Results:  None Other Comments:  None  Review of Systems  Constitutional: Negative for fever.  Respiratory: Negative for shortness of breath.   Gastrointestinal: Positive for abdominal pain.  Neurological: Positive for headaches. Negative for loss of consciousness and weakness.  All other systems reviewed and are negative.  Dilation: 3 Effacement (%): 50 Station: -3 Exam by:: Marvel Plan RN  Blood pressure 115/73, pulse 96, temperature 98.6 F (37 C), temperature source Oral, resp. rate 18, height  5\' 2"  (1.575 m), weight 83.2 kg, last menstrual period 06/14/2018, SpO2 99 %. Physical Exam  Nursing note and vitals reviewed. Constitutional: She is oriented to person, place, and time. She appears well-developed and well-nourished.  Cardiovascular: Normal rate.  Respiratory: Effort normal and breath sounds normal. No respiratory distress.  GI: She exhibits no distension. There is no abdominal tenderness. There is no rebound and no guarding.  Gravid  Neurological: She is alert and oriented to person, place, and time.  Skin: Skin is warm and dry.  Psychiatric: She has a normal mood and affect. Her behavior is normal. Judgment and thought content normal.    Fetal Well-Being Category I tracing: baseline 135, moderate variability, positive accels, no decels Toco: irregular moderate contractions q 2-5 min   Prenatal labs: ABO, Rh: O/Positive/-- (11/06 1613) Antibody: Negative (03/11 0842) Rubella: 3.23 (11/06 1613) RPR: Non Reactive (03/11 0842)  HBsAg: Negative (11/06 1613)  HIV: Non Reactive (03/11 0842)  GBS: Negative (05/08 1330)   Assessment/Plan: --28 y.o. G3P1011 at [redacted]w[redacted]d  --Category I tracing --Cervical change to 3cm during labor evaluation in MAU --gHTN, normotensive in MAU + Headache, PEC labs pending   Female/breast/POPs or IUD   Calvert Cantor, CNM 03/22/2019, 2:38 PM

## 2019-03-22 NOTE — Progress Notes (Addendum)
Michele Bates is a 28 y.o. G3P1011 at [redacted]w[redacted]d  Subjective: Patient reports awareness of contractions but states she "feels about the same" as she did when admitted from MAU.  Objective: BP 115/69   Pulse (!) 110   Temp 98.1 F (36.7 C) (Oral)   Resp 15   Ht 5\' 2"  (1.575 m)   Wt 83.2 kg   LMP 06/14/2018   SpO2 99%   BMI 33.56 kg/m  No intake/output data recorded. Total I/O In: 309.7 [P.O.:240; I.V.:69.7] Out: -   FHT:  FHR: 135 bpm, variability: moderate,  accelerations:  Present,  decelerations:  Absent UC:   irregular, every 3-6 minutes SVE:   Dilation: 3 Effacement (%): 50 Station: -3 Exam by:: S.Weinhold,CNM  Labs: Lab Results  Component Value Date   WBC 7.5 03/22/2019   HGB 12.8 03/22/2019   HCT 38.0 03/22/2019   MCV 93.4 03/22/2019   PLT 215 03/22/2019   Assessment / Plan: Category I tracing Cervix essentially unchanged from admission exam at 1400 hours Foley bulb and Cytotec #1 at 1755 SROM'd with foley bulb placement Normotensive, no severe signs or symptoms Headache present on admission resolved with Tylenol given in MAU S/p normal PEC labs on admission Afebrile Anticipate NSVD  Calvert Cantor, CNM 03/22/2019, 6:00 PM

## 2019-03-22 NOTE — MAU Note (Signed)
Michele Bates is a 28 y.o. at [redacted]w[redacted]d here in MAU reporting: contractions for 2 days but today they are worse. BP at home today was 152/80, states she has been watching her BP. States her hands are swollen but no headache or vision changes. No LOF, no bleeding, + FM. Last SVE was 1.5cm  Onset of complaint: 2 days  Pain score: 8/10  Vitals:   03/22/19 1250  BP: 119/81  Pulse: (!) 107  Resp: 18  Temp: 98.6 F (37 C)  SpO2: 99%      FHT: + FM  Lab orders placed from triage: none

## 2019-03-22 NOTE — Anesthesia Procedure Notes (Signed)
Epidural Patient location during procedure: OB Start time: 03/22/2019 11:40 PM End time: 03/22/2019 11:44 PM  Staffing Anesthesiologist: Bethena Midget, MD  Preanesthetic Checklist Completed: patient identified, site marked, surgical consent, pre-op evaluation, timeout performed, IV checked, risks and benefits discussed and monitors and equipment checked  Epidural Patient position: sitting Prep: site prepped and draped and DuraPrep Patient monitoring: continuous pulse ox and blood pressure Approach: midline Location: L3-L4 Injection technique: LOR air  Needle:  Needle type: Tuohy  Needle gauge: 17 G Needle length: 9 cm and 9 Needle insertion depth: 6 cm Catheter type: closed end flexible Catheter size: 19 Gauge Catheter at skin depth: 11 cm Test dose: negative  Assessment Events: blood not aspirated, injection not painful, no injection resistance, negative IV test and no paresthesia

## 2019-03-23 ENCOUNTER — Encounter (HOSPITAL_COMMUNITY): Payer: Self-pay

## 2019-03-23 DIAGNOSIS — Z3A38 38 weeks gestation of pregnancy: Secondary | ICD-10-CM

## 2019-03-23 DIAGNOSIS — O139 Gestational [pregnancy-induced] hypertension without significant proteinuria, unspecified trimester: Secondary | ICD-10-CM

## 2019-03-23 DIAGNOSIS — O134 Gestational [pregnancy-induced] hypertension without significant proteinuria, complicating childbirth: Secondary | ICD-10-CM

## 2019-03-23 LAB — RPR: RPR Ser Ql: NONREACTIVE

## 2019-03-23 MED ORDER — DOCUSATE SODIUM 100 MG PO CAPS
100.0000 mg | ORAL_CAPSULE | Freq: Two times a day (BID) | ORAL | Status: DC
  Administered 2019-03-23: 22:00:00 100 mg via ORAL
  Filled 2019-03-23: qty 1

## 2019-03-23 MED ORDER — IBUPROFEN 800 MG PO TABS
800.0000 mg | ORAL_TABLET | Freq: Three times a day (TID) | ORAL | Status: DC
  Administered 2019-03-23: 11:00:00 800 mg via ORAL
  Filled 2019-03-23: qty 1

## 2019-03-23 MED ORDER — COCONUT OIL OIL: 1.0000 "application " | TOPICAL_OIL | Status: DC | PRN

## 2019-03-23 MED ORDER — SIMETHICONE 80 MG PO CHEW: 80.0000 mg | CHEWABLE_TABLET | ORAL | Status: DC | PRN

## 2019-03-23 MED ORDER — BENZOCAINE-MENTHOL 20-0.5 % EX AERO: 1.0000 "application " | INHALATION_SPRAY | CUTANEOUS | Status: DC | PRN

## 2019-03-23 MED ORDER — DIPHENHYDRAMINE HCL 25 MG PO CAPS: 25.0000 mg | ORAL_CAPSULE | Freq: Four times a day (QID) | ORAL | Status: DC | PRN

## 2019-03-23 MED ORDER — TETANUS-DIPHTH-ACELL PERTUSSIS 5-2.5-18.5 LF-MCG/0.5 IM SUSP: 0.5000 mL | Freq: Once | INTRAMUSCULAR | Status: DC

## 2019-03-23 MED ORDER — IBUPROFEN 800 MG PO TABS
800.0000 mg | ORAL_TABLET | Freq: Three times a day (TID) | ORAL | Status: DC
  Administered 2019-03-23 – 2019-03-24 (×2): 800 mg via ORAL
  Filled 2019-03-23 (×2): qty 1

## 2019-03-23 MED ORDER — PRENATAL MULTIVITAMIN CH
1.0000 | ORAL_TABLET | Freq: Every day | ORAL | Status: DC
  Administered 2019-03-23: 11:00:00 1 via ORAL
  Filled 2019-03-23: qty 1

## 2019-03-23 MED ORDER — MEASLES, MUMPS & RUBELLA VAC IJ SOLR: 0.5000 mL | Freq: Once | INTRAMUSCULAR | Status: DC

## 2019-03-23 MED ORDER — ONDANSETRON HCL 4 MG PO TABS: 4.0000 mg | ORAL_TABLET | ORAL | Status: DC | PRN

## 2019-03-23 MED ORDER — ONDANSETRON HCL 4 MG/2ML IJ SOLN: 4.0000 mg | INTRAMUSCULAR | Status: DC | PRN

## 2019-03-23 MED ORDER — ACETAMINOPHEN 325 MG PO TABS: 650.0000 mg | ORAL_TABLET | ORAL | Status: DC | PRN

## 2019-03-23 MED ORDER — DIBUCAINE (PERIANAL) 1 % EX OINT: 1.0000 "application " | TOPICAL_OINTMENT | CUTANEOUS | Status: DC | PRN

## 2019-03-23 MED ORDER — WITCH HAZEL-GLYCERIN EX PADS: 1.0000 "application " | MEDICATED_PAD | CUTANEOUS | Status: DC | PRN

## 2019-03-23 NOTE — Lactation Note (Addendum)
This note was copied from a baby's chart. Lactation Consultation Note  Patient Name: Michele Bates [id number] Date: 03/23/2019 Reason for consult: Initial assessment;Term  1415 - 47 - I visited [name] to conduct initial breast feeding education and to assist with breast feeding. She states that she breast fed her previous child, now 7, for 2 years.  She states that her daughter, Devra Dopp, latched at delivery and has fed approximately 3-4 times since. I recommended that mom feed her baby 8-12 times a day, on demand, and wake baby to feed if baby does not cue within three hours of last feeding.  We discussed normal infant feeding patterns in the first 24 hours and nighttime cluster feeding.  Mom states that baby latches better on her right breast, and that she asked for a manual pump to stimulate her left breast and to help evert her nipple.  Baby was curing, and I assisted with latching. Initially, we were unable to achieve latch on mom's left breast in cradle hold. I moved baby to football hold and instructed mom to HE colostrum onto her nipple. I showed mom how to latch baby in football, and baby latched with rhythmic suckling sequences. Mom's breast tissue is somewhat firm, and her left nipple is evert but short.  Mom states that this is the best feeding baby has done on her left breast.  I recommended mom pre-pump five to ten minutes prior to feeding. I also suggested that she pump the left breast if baby is not latching consistently on this side. Mom verbalized understanding.  I reviewed output expectations and shared our community breast feeding resources.   Maternal Data Formula Feeding for Exclusion: No Has patient been taught Hand Expression?: Yes  Feeding Feeding Type: Breast Fed  LATCH Score Latch: Grasps breast easily, tongue down, lips flanged, rhythmical sucking.  Audible Swallowing: A few with stimulation  Type of Nipple: Everted at rest and after  stimulation  Comfort (Breast/Nipple): Soft / non-tender  Hold (Positioning): Assistance needed to correctly position infant at breast and maintain latch.  LATCH Score: 8  Interventions Interventions: Breast feeding basics reviewed;Assisted with latch;Skin to skin;Hand express;Pre-pump if needed;Support pillows;Adjust position;Breast compression  Lactation Tools Discussed/Used Tools: (spoon) Pump Review: Setup, frequency, and cleaning   Consult Status Consult Status: Follow-up Date: 03/24/19 Follow-up type: In-patient    Walker Shadow 03/23/2019, 2:59 PM

## 2019-03-23 NOTE — Progress Notes (Signed)
OB/GYN Faculty Practice: Labor Progress Note  Subjective: Strip note, plan of care discussed with RN. Patient just received epidural. Progressing well, comfortable now that epidural in place.   Objective: BP 108/63 (BP Location: Right Arm)   Pulse 94   Temp 98.8 F (37.1 C) (Oral)   Resp 20   Ht 5\' 2"  (1.575 m)   Wt 83.2 kg   LMP 06/14/2018   SpO2 99%   BMI 33.56 kg/m  Gen: strip note Dilation: 4.5 Effacement (%): 60 Cervical Position: Posterior Station: -2 Presentation: Vertex Exam by:: Rogers RN  Assessment and Plan: 28 y.o. [id number] [redacted]w[redacted]d here with early labor as well as gestational hypertension.   Labor: Having regular contraction pattern, transitioned into active labor. Continue pitocin per protocol. Anticipate SVD.  -- pain control: epidural  -- PPH Risk: low  Fetal Well-Being: Cephalic by sutures on RN check.  -- Category I - continuous fetal monitoring  -- GBS negative   GHTN:  BP in normal range since arrival. Has history of elevated BP at home but then improve on recheck, has also had elevated BP in clinic. UPC undetectable, labs wnl. -- continue to monitor closely   Nivek Powley S. Earlene Plater, DO OB/GYN Fellow, Faculty Practice  12:25 AM

## 2019-03-23 NOTE — Discharge Summary (Signed)
Obstetrics Discharge Summary OB/GYN Faculty Practice   Patient Name: Michele Bates DOB: August 09, 1991 MRN: [id number]  Date of admission: 03/22/2019 Delivering MD: Tamera StandsWALLACE, LAUREL S   Date of discharge: 03/24/2019  Admitting diagnosis: CTX 5 MIN APART Intrauterine pregnancy: 2157w0d     Secondary diagnosis:   Active Problems:   Labor and delivery indication for care or intervention   Spontaneous onset of labor after 37 but before 39 completed weeks gestation with delivery by planned cesarean section    Discharge diagnosis: Term Pregnancy Delivered                                         Postpartum procedures: None  Complications: GHTN  Outpatient Follow-Up: [ ]  Rx for POPs or IUD at postpartum visit [x]  BP check - message sent to FT to schedule   Hospital course: Michele GaribaldiGuadalupe R Buzzell is a 28 y.o. 4857w0d who was admitted for early labor in the setting of presumed gestational hypertension based on home blood pressure monitoring. Her pregnancy was complicated by above noted. Her labor course was notable for augmentation with FB, cytotec, SROM, epidural placement and pitocin. Delivery was uncomplicated. Please see delivery/op note for additional details. Her postpartum course was uncomplicated. She was breastfeeding without difficulty. By day of discharge, she was passing flatus, urinating, eating and drinking without difficulty. Her pain was well-controlled, and she was discharged home with ibuprofen. She will follow-up in clinic in 6 weeks. She will have blood pressure check in 3 days.   Physical exam  Vitals:   03/23/19 1400 03/23/19 1758 03/23/19 2208 03/24/19 0507  BP: 107/72 (!) 98/59 110/69 107/78  Pulse: 99 91 83 81  Resp: 17 20 18 16   Temp: 98.5 F (36.9 C) 98.8 F (37.1 C) 98.4 F (36.9 C) 98 F (36.7 C)  TempSrc:   Oral Oral  SpO2:   98% 100%  Weight:      Height:       General: Alert, oriented, no distress Lochia: appropriate Uterine Fundus: firm Incision: N/A DVT  Evaluation: No evidence of DVT seen on physical exam.  Labs: Lab Results  Component Value Date   WBC 7.5 03/22/2019   HGB 12.8 03/22/2019   HCT 38.0 03/22/2019   MCV 93.4 03/22/2019   PLT 215 03/22/2019   CMP Latest Ref Rng & Units 03/22/2019  Glucose 70 - 99 mg/dL 80  BUN 6 - 20 mg/dL <4(U<5(L)  Creatinine 9.810.44 - 1.00 mg/dL 1.910.44  Sodium 478135 - 295145 mmol/L 134(L)  Potassium 3.5 - 5.1 mmol/L 3.6  Chloride 98 - 111 mmol/L 107  CO2 22 - 32 mmol/L 18(L)  Calcium 8.9 - 10.3 mg/dL 9.2  Total Protein 6.5 - 8.1 g/dL 6.2(L)  Total Bilirubin 0.3 - 1.2 mg/dL 0.9  Alkaline Phos 38 - 126 U/L 212(H)  AST 15 - 41 U/L 19  ALT 0 - 44 U/L 16    Discharge instructions: Per After Visit Summary and "Baby and Me Booklet"  After visit meds:  Allergies as of 03/24/2019   No Known Allergies     Medication List    TAKE these medications   calcium carbonate 500 MG chewable tablet Commonly known as:  TUMS - dosed in mg elemental calcium Chew 1 tablet by mouth daily as needed for indigestion or heartburn.   ibuprofen 800 MG tablet Commonly known as:  ADVIL Take 1 tablet (800 mg  total) by mouth every 8 (eight) hours as needed.   prenatal multivitamin Tabs tablet Take 1 tablet by mouth daily at 12 noon.       Postpartum contraception: POPs or IUD, leaning more towards POPs  Diet: Routine Diet Activity: Advance as tolerated. Pelvic rest for 6 weeks.   Follow-up Appt: Future Appointments  Date Time Provider Department Center  04/01/2019 11:00 AM [facility] NURSE [facility]  04/28/2019 10:30 AM Cheral Marker, CNM [facility]   Follow-up Visit:No follow-ups on file.  Please schedule this patient for Postpartum visit in: 4 weeks with the following provider: Any provider High risk pregnancy complicated by: gestational hypertension Delivery mode:  SVD Anticipated Birth Control:  POPs or IUD PP Procedures needed: BP check  Schedule Integrated BH visit: no  Newborn Data: Live  born female  Birth Weight:  3290 g APGAR: 8, 9  Newborn Delivery   Birth date/time:  03/23/2019 01:36:00 Delivery type:  Vaginal, Spontaneous     Baby Feeding: Breast Disposition:home with mother    Thressa Sheller DNP, CNM  03/24/19  9:55 AM

## 2019-03-23 NOTE — Anesthesia Postprocedure Evaluation (Signed)
Anesthesia Post Note  Patient: Michele Bates  Procedure(s) Performed: AN AD HOC LABOR EPIDURAL     Patient location during evaluation: Mother Baby Anesthesia Type: Epidural Level of consciousness: awake and alert Pain management: pain level controlled Vital Signs Assessment: post-procedure vital signs reviewed and stable Respiratory status: spontaneous breathing, nonlabored ventilation and respiratory function stable Cardiovascular status: stable Postop Assessment: no headache, no backache and epidural receding Anesthetic complications: no    Last Vitals:  Vitals:   03/23/19 0345 03/23/19 0445  BP: 111/73 118/70  Pulse: (!) 105 (!) 110  Resp: 16 18  Temp: 37.4 C 37.4 C  SpO2: 99% 99%    Last Pain:  Vitals:   03/23/19 0445  TempSrc: Oral  PainSc: 0-No pain   Pain Goal:                   Ej Pinson

## 2019-03-24 MED ORDER — IBUPROFEN 800 MG PO TABS: 800.0000 mg | ORAL_TABLET | Freq: Three times a day (TID) | ORAL | 0 refills | Status: DC | PRN

## 2019-03-24 NOTE — Lactation Note (Signed)
This note was copied from a baby's chart. Lactation Consultation Note  Patient Name: Michele Bates [id number] Date: 03/24/2019 Reason for consult: Follow-up assessment;Early term 37-38.6wks Baby is 33 hours old/5% weight loss.  Mom states feedings are going well.  Baby cluster fed last night.  Discussed milk coming to volume and the prevention and treatment of engorgement.  She has a manual pump for prn use.  No questions or concerns.  Lactation services reviewed and encouraged prn.  Maternal Data    Feeding    LATCH Score                   Interventions    Lactation Tools Discussed/Used     Consult Status Consult Status: Complete Follow-up type: Call as needed    Huston Foley 03/24/2019, 11:35 AM

## 2019-03-24 NOTE — Discharge Instructions (Signed)

## 2019-03-28 ENCOUNTER — Encounter: Payer: Medicaid Other | Admitting: Obstetrics & Gynecology

## 2019-03-28 ENCOUNTER — Other Ambulatory Visit: Payer: Self-pay

## 2019-03-28 ENCOUNTER — Ambulatory Visit: Payer: Medicaid Other | Admitting: *Deleted

## 2019-03-28 VITALS — BP 119/79 | HR 63

## 2019-03-28 DIAGNOSIS — Z0130 Encounter for examination of blood pressure without abnormal findings: Secondary | ICD-10-CM

## 2019-03-28 NOTE — Progress Notes (Signed)
Pt in for bp check. She delivered on 03/23/19. No problems or concerns. She denies headaches, or blurry vision. Baby is doing well.

## 2019-04-14 ENCOUNTER — Encounter: Payer: Self-pay | Admitting: *Deleted

## 2019-04-28 ENCOUNTER — Encounter: Payer: Self-pay | Admitting: Women's Health

## 2019-04-28 ENCOUNTER — Ambulatory Visit (INDEPENDENT_AMBULATORY_CARE_PROVIDER_SITE_OTHER): Payer: Medicaid Other | Admitting: *Deleted

## 2019-04-28 ENCOUNTER — Ambulatory Visit (INDEPENDENT_AMBULATORY_CARE_PROVIDER_SITE_OTHER): Payer: Medicaid Other | Admitting: Women's Health

## 2019-04-28 ENCOUNTER — Other Ambulatory Visit: Payer: Self-pay

## 2019-04-28 VITALS — BP 110/73 | HR 87

## 2019-04-28 DIAGNOSIS — Z3483 Encounter for supervision of other normal pregnancy, third trimester: Secondary | ICD-10-CM

## 2019-04-28 DIAGNOSIS — Z0130 Encounter for examination of blood pressure without abnormal findings: Secondary | ICD-10-CM

## 2019-04-28 MED ORDER — NORETHINDRONE 0.35 MG PO TABS: 1.0000 | ORAL_TABLET | Freq: Every day | ORAL | 3 refills | Status: DC

## 2019-04-28 NOTE — Progress Notes (Signed)
Bp check.  Bp normal today. No f/u needed

## 2019-04-28 NOTE — Progress Notes (Signed)
TELEHEALTH VIRTUAL POSTPARTUM VISIT ENCOUNTER NOTE Patient name: Michele Bates MRN [id number]  Date of birth: 11-25-1990  I connected with patient on 04/28/19 at 10:30 AM EDT by [facility] and verified that I am speaking with the correct person using two identifiers. Due to COVID-19 recommendations, pt is not currently in our office.    I discussed the limitations, risks, security and privacy concerns of performing an evaluation and management service by telephone and the availability of in person appointments. I also discussed with the patient that there may be a patient responsible charge related to this service. The patient expressed understanding and agreed to proceed.  Chief Complaint:   Postpartum Care  History of Present Illness:   Michele Bates is a 28 y.o. G25P2012 Hispanic female being evaluated today for a postpartum visit. She is 5 weeks postpartum following a spontaneous vaginal delivery at 38.0 gestational weeks. Anesthesia: epidural. Laceration: none. I have fully reviewed the prenatal and intrapartum course. Pregnancy uncomplicated. Concern for GHTN, however all elevated bp's were with her home bp cuff, which she thinks is off, bp was always normal when she came in, and during entire hospital stay. Denies ha, visual changes, ruq/epigastric pain, n/v.   Postpartum course has been uncomplicated. Bleeding no bleeding. Bowel function is normal. Bladder function is normal.  Patient is not sexually active. Last sexual activity: prior to birth of baby.  Contraception method is wants POPs.  Last pap 2018 @ [facility].  Results were normal .  No LMP recorded.  Baby's course has been uncomplicated. Baby is feeding by breast.   Flavia Shipper Postpartum Depression Screening: negative Edinburgh Postnatal Depression Scale - 04/28/19 1045      Edinburgh Postnatal Depression Scale:  In the Past 7 Days   I have been able to laugh and see the funny side of things.  0    I have looked forward with  enjoyment to things.  0    I have blamed myself unnecessarily when things went wrong.  0    I have been anxious or worried for no good reason.  0    I have felt scared or panicky for no good reason.  0    Things have been getting on top of me.  0    I have been so unhappy that I have had difficulty sleeping.  0    I have felt sad or miserable.  0    I have been so unhappy that I have been crying.  0    The thought of harming myself has occurred to me.  0    Edinburgh Postnatal Depression Scale Total  0      Review of Systems:   Pertinent items are noted in HPI Denies Abnormal vaginal discharge w/ itching/odor/irritation, headaches, visual changes, shortness of breath, chest pain, abdominal pain, severe nausea/vomiting, or problems with urination or bowel movements. Pertinent History Reviewed:  Reviewed past medical,surgical, obstetrical and family history.  Reviewed problem list, medications and allergies. OB History  Gravida Para Term Preterm AB Living  3 2 2   1 2   SAB TAB Ectopic Multiple Live Births  1     0 2    # Outcome Date GA Lbr Len/2nd Weight Sex Delivery Anes PTL Lv  3 Term 03/23/19 [redacted]w[redacted]d 02:45 / 00:21 7 lb 4.1 oz (3.29 kg) F Vag-Spont EPI  LIV  2 SAB 10/22/17 [redacted]w[redacted]d         1 Term 08/18/11 [redacted]w[redacted]d  7 lb  5 oz (3.317 kg) F Vag-Spont EPI N LIV   Physical Assessment:   Vitals:   04/28/19 1042  BP: (!) 149/80  Pulse: 75  There is no height or weight on file to calculate BMI.       Physical Examination:  General:  Alert, oriented and cooperative.   Mental Status: Normal mood and affect perceived. Normal judgment and thought content.  Rest of physical exam deferred due to type of encounter       No results found for this or any previous visit (from the past 24 hour(s)).  Assessment & Plan:  1) Postpartum exam 2) 5 wks s/p SVB 3) Breastfeeding 4) Depression screening 5) Contraception counseling, pt prefers oral progesterone-only contraceptive. Rx micronor w/ 11RF,  understands has to take at exact same time daily to be effective, if late taking use condom as back-up  6) Elevated bp> w/ home cuff, to come in for bp check w/ nurse  Meds:  Meds ordered this encounter  Medications  . norethindrone (MICRONOR) 0.35 MG tablet    Sig: Take 1 tablet (0.35 mg total) by mouth daily.    Dispense:  3 Package    Refill:  3    Order Specific Question:   Supervising Provider    Answer:   Duane LopeEURE, LUTHER H [[id number]]    I discussed the assessment and treatment plan with the patient. The patient was provided an opportunity to ask questions and all were answered. The patient agreed with the plan and demonstrated an understanding of the instructions.   The patient was advised to call back or seek an in-person evaluation/go to the ED for any concerning postpartum symptoms.  I provided 15 minutes of non-face-to-face time during this encounter.  Follow-up: Return for today for bp check w/ nurse.   No orders of the defined types were placed in this encounter.   Cheral MarkerKimberly R Erika Hussar CNM, [facility]WHNP-BC 04/28/2019 11:02 AM

## 2019-04-28 NOTE — Patient Instructions (Signed)
Norethindrone tablets (contraception)  What is this medicine?  NORETHINDRONE (nor eth IN drone) is an oral contraceptive. The product contains a female hormone known as a progestin. It is used to prevent pregnancy.  This medicine may be used for other purposes; ask your health care provider or pharmacist if you have questions.  COMMON BRAND NAME(S): Camila, Deblitane 28-Day, Errin, Heather, Jencycla, Jolivette, Lyza, Nor-QD, Nora-BE, Norlyroc, Ortho Micronor, Sharobel 28-Day  What should I tell my health care provider before I take this medicine?  They need to know if you have any of these conditions:  -blood vessel disease or blood clots  -breast, cervical, or vaginal cancer  -diabetes  -heart disease  -kidney disease  -liver disease  -mental depression  -migraine  -seizures  -stroke  -vaginal bleeding  -an unusual or allergic reaction to norethindrone, other medicines, foods, dyes, or preservatives  -pregnant or trying to get pregnant  -breast-feeding  How should I use this medicine?  Take this medicine by mouth with a glass of water. You may take it with or without food. Follow the directions on the prescription label. Take this medicine at the same time each day and in the order directed on the package. Do not take your medicine more often than directed.  Contact your pediatrician regarding the use of this medicine in children. Special care may be needed. This medicine has been used in female children who have started having menstrual periods.  A patient package insert for the product will be given with each prescription and refill. Read this sheet carefully each time. The sheet may change frequently.  Overdosage: If you think you have taken too much of this medicine contact a poison control center or emergency room at once.  NOTE: This medicine is only for you. Do not share this medicine with others.  What if I miss a dose?  Try not to miss a dose. Every time you miss a dose or take a dose late your chance of  pregnancy increases. When 1 pill is missed (even if only 3 hours late), take the missed pill as soon as possible and continue taking a pill each day at the regular time (use a back up method of birth control for the next 48 hours). If more than 1 dose is missed, use an additional birth control method for the rest of your pill pack until menses occurs. Contact your health care professional if more than 1 dose has been missed.  What may interact with this medicine?  Do not take this medicine with any of the following medications:  -amprenavir or fosamprenavir  -bosentan  This medicine may also interact with the following medications:  -antibiotics or medicines for infections, especially rifampin, rifabutin, rifapentine, and griseofulvin, and possibly penicillins or tetracyclines  -aprepitant  -barbiturate medicines, such as phenobarbital  -carbamazepine  -felbamate  -modafinil  -oxcarbazepine  -phenytoin  -ritonavir or other medicines for HIV infection or AIDS  -St. John's wort  -topiramate  This list may not describe all possible interactions. Give your health care provider a list of all the medicines, herbs, non-prescription drugs, or dietary supplements you use. Also tell them if you smoke, drink alcohol, or use illegal drugs. Some items may interact with your medicine.  What should I watch for while using this medicine?  Visit your doctor or health care professional for regular checks on your progress. You will need a regular breast and pelvic exam and Pap smear while on this medicine.  Use an additional   method of birth control during the first cycle that you take these tablets.  If you have any reason to think you are pregnant, stop taking this medicine right away and contact your doctor or health care professional.  If you are taking this medicine for hormone related problems, it may take several cycles of use to see improvement in your condition.  This medicine does not protect you against HIV infection (AIDS)  or any other sexually transmitted diseases.  What side effects may I notice from receiving this medicine?  Side effects that you should report to your doctor or health care professional as soon as possible:  -breast tenderness or discharge  -pain in the abdomen, chest, groin or leg  -severe headache  -skin rash, itching, or hives  -sudden shortness of breath  -unusually weak or tired  -vision or speech problems  -yellowing of skin or eyes  Side effects that usually do not require medical attention (report to your doctor or health care professional if they continue or are bothersome):  -changes in sexual desire  -change in menstrual flow  -facial hair growth  -fluid retention and swelling  -headache  -irritability  -nausea  -weight gain or loss  This list may not describe all possible side effects. Call your doctor for medical advice about side effects. You may report side effects to FDA at 1-800-FDA-1088.  Where should I keep my medicine?  Keep out of the reach of children.  Store at room temperature between 15 and 30 degrees C (59 and 86 degrees F). Throw away any unused medicine after the expiration date.  NOTE: This sheet is a summary. It may not cover all possible information. If you have questions about this medicine, talk to your doctor, pharmacist, or health care provider.  © 2019 [employer]/Gold Standard (2012-07-12 16:41:35)

## 2019-06-05 IMAGING — US US OB COMP LESS 14 WK
1 series · 13 of 28 positions shown · non-contrast
Comparison: None.

CLINICAL DATA: Initial evaluation for acute vaginal bleeding, early
pregnancy.

EXAM:
OBSTETRIC <14 WK US AND TRANSVAGINAL OB US
TECHNIQUE: Both transabdominal and transvaginal ultrasound examinations were
performed for complete evaluation of the gestation as well as the
maternal uterus, adnexal regions, and pelvic cul-de-sac.
Transvaginal technique was performed to assess early pregnancy.

[Series 1: us ob comp less 14 wk · 0.20mm/px · 13 of 66 slices shown]
[im 3/66]
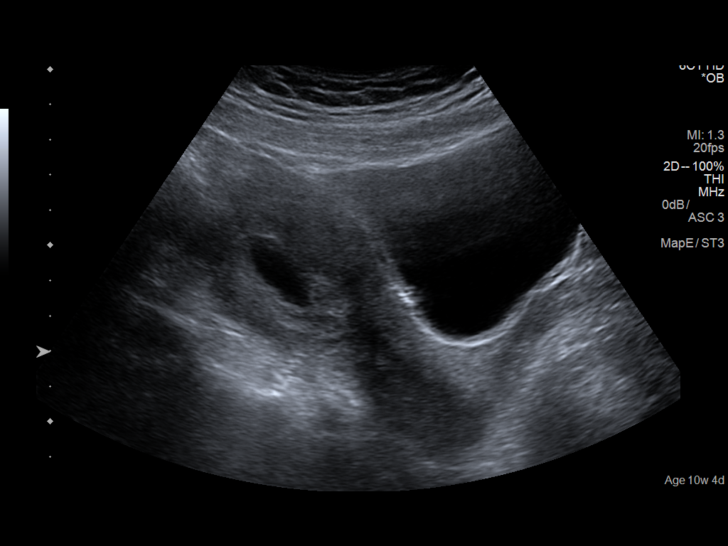
[im 8/66]
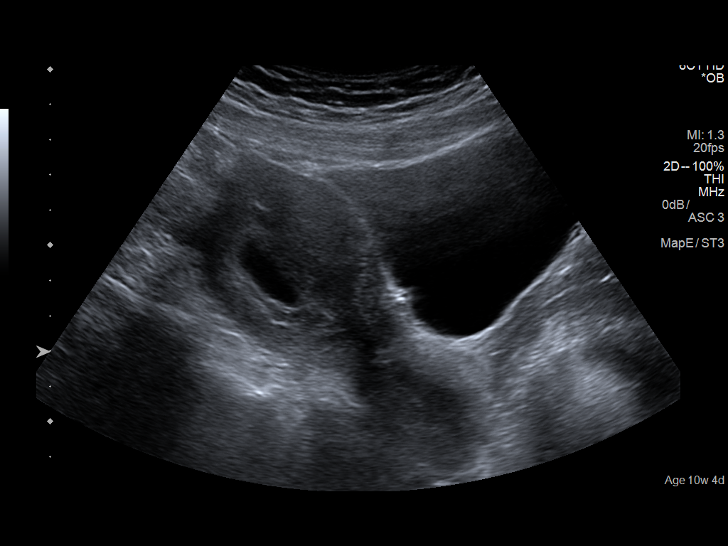
[im 13/66]
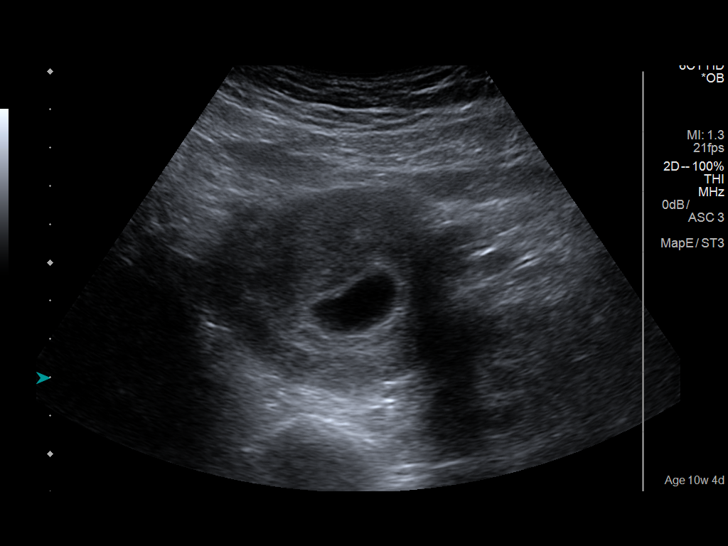
[im 17/66]
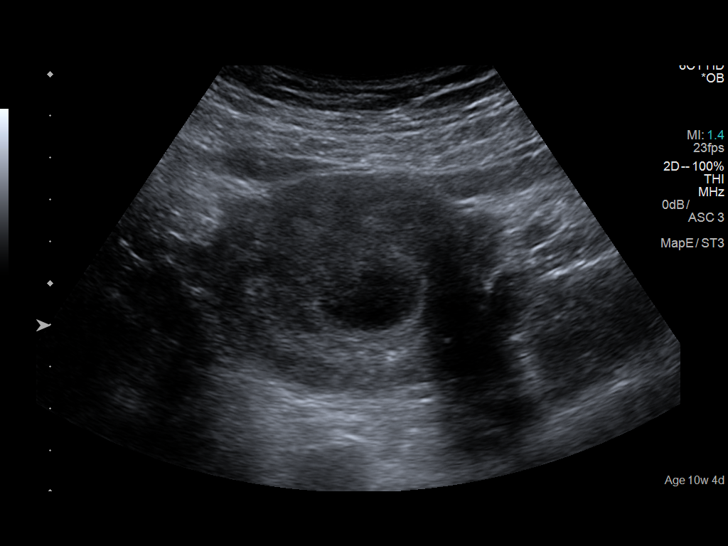
[im 22/66]
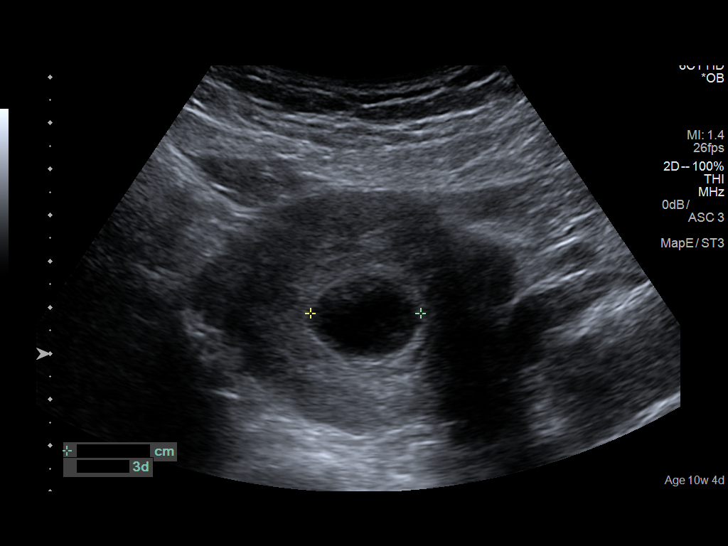
[im 27/66]
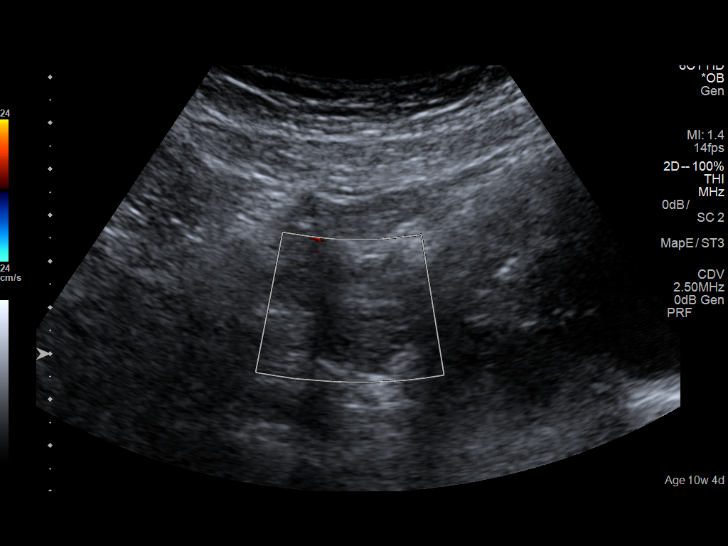
[im 34/66]
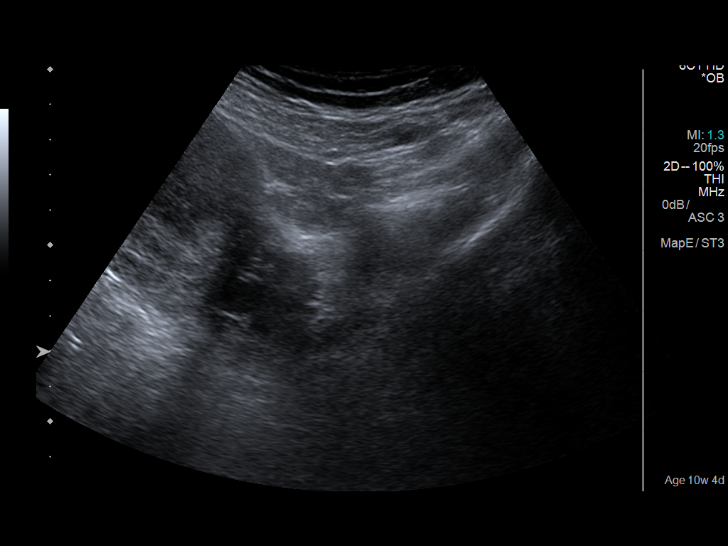
[im 39/66]
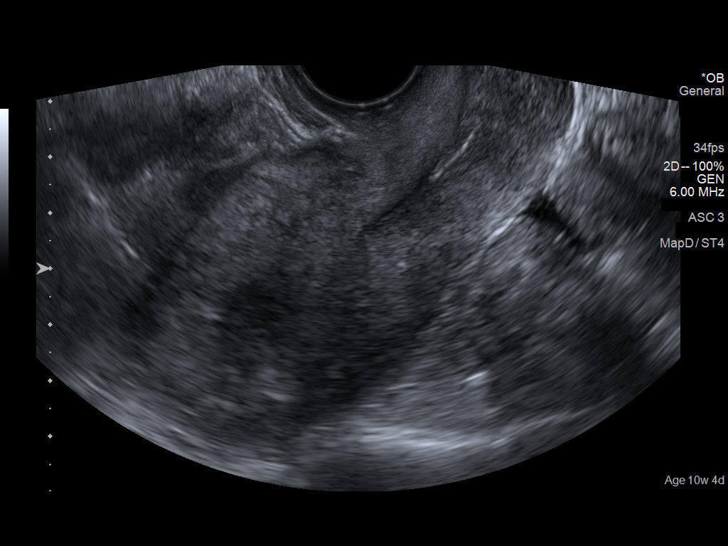
[im 44/66]
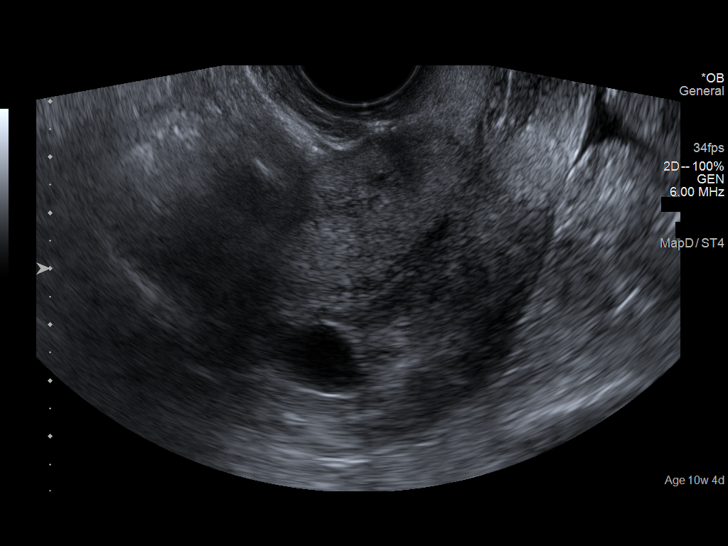
[im 49/66]
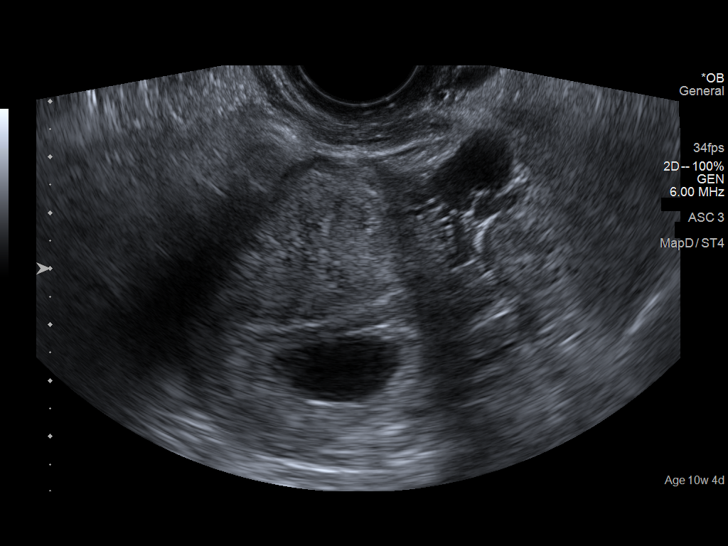
[im 53/66]
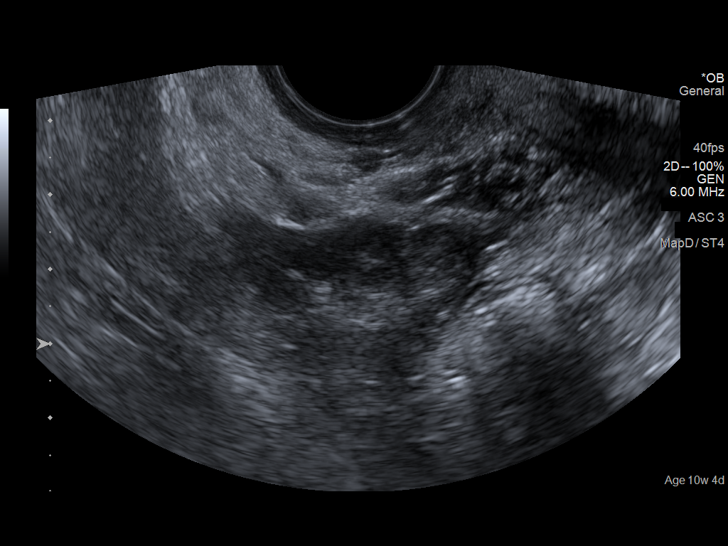
[im 58/66]
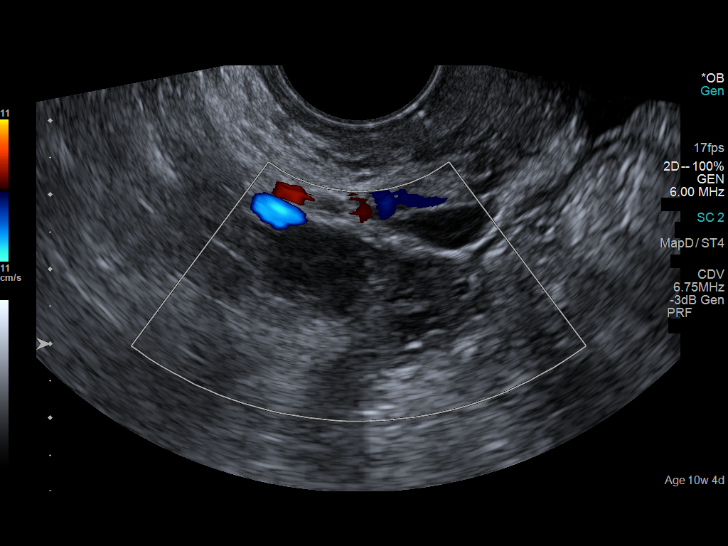
[im 63/66]
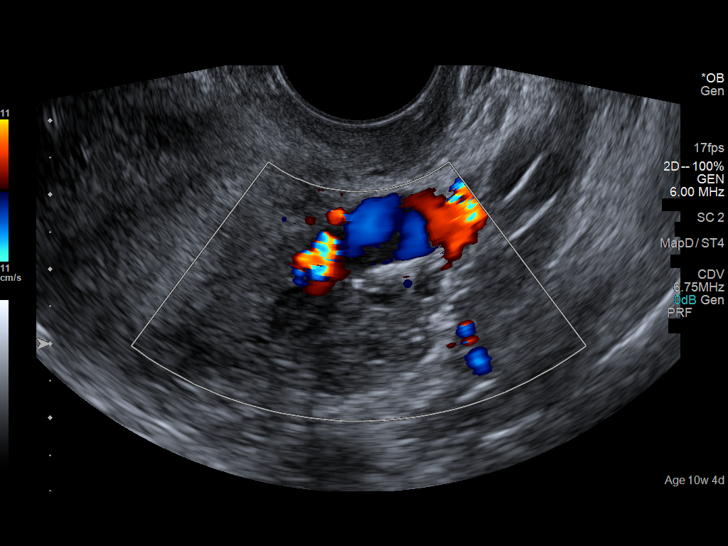

[13 of 28 positions shown; findings below may reference images not displayed]

FINDINGS: Intrauterine gestational sac: Single

Yolk sac:  Not visualized.

Embryo:  Not visualized.

Cardiac Activity: N/A

Heart Rate: N/A  bpm

MSD: 17.6  mm   6 w   5  d

Subchorionic hemorrhage:  None visualized.

Maternal uterus/adnexae: Ovaries well visualized and are normal in
appearance bilaterally. No adnexal mass. No free fluid.
IMPRESSION: 1. Probable early intrauterine gestational sac, but no yolk sac,
fetal pole, or cardiac activity yet visualized. Recommend follow-up
quantitative B-HCG levels and follow-up US in 14 days to assess
viability. This recommendation follows SRU consensus guidelines:
Diagnostic Criteria for Nonviable Pregnancy Early in the First
Trimester. N Engl J Med 4983; [DATE].
2. No other acute maternal uterine or adnexal abnormality
identified.

## 2019-09-10 DIAGNOSIS — Z0120 Encounter for dental examination and cleaning without abnormal findings: Secondary | ICD-10-CM | POA: Diagnosis not present

## 2019-11-10 DIAGNOSIS — Z0120 Encounter for dental examination and cleaning without abnormal findings: Secondary | ICD-10-CM | POA: Diagnosis not present

## 2020-01-13 DIAGNOSIS — Z0120 Encounter for dental examination and cleaning without abnormal findings: Secondary | ICD-10-CM | POA: Diagnosis not present

## 2020-05-18 ENCOUNTER — Encounter: Payer: Self-pay | Admitting: Adult Health

## 2020-05-18 ENCOUNTER — Ambulatory Visit (INDEPENDENT_AMBULATORY_CARE_PROVIDER_SITE_OTHER): Payer: Medicaid Other | Admitting: Adult Health

## 2020-05-18 VITALS — BP 113/69 | HR 88 | Ht 62.0 in | Wt 154.0 lb

## 2020-05-18 DIAGNOSIS — O3680X0 Pregnancy with inconclusive fetal viability, not applicable or unspecified: Secondary | ICD-10-CM | POA: Diagnosis not present

## 2020-05-18 DIAGNOSIS — Z3A09 9 weeks gestation of pregnancy: Secondary | ICD-10-CM | POA: Diagnosis not present

## 2020-05-18 DIAGNOSIS — Z3201 Encounter for pregnancy test, result positive: Secondary | ICD-10-CM | POA: Insufficient documentation

## 2020-05-18 LAB — POCT URINE PREGNANCY: Preg Test, Ur: POSITIVE — AB

## 2020-05-18 NOTE — Progress Notes (Signed)
  Subjective:     Patient ID: Michele Bates, female   DOB: [birth year], 29 y.o.   MRN: [id number]  HPI Michele Bates is a 29 year old Hispanic female, married, in for UPT, has missed 2 periods and had 2+HPTs. PCP IS [facility].  Review of Systems +missed 2 periods and had 2+HPT Had some vomiting none now Reviewed past medical,surgical, social and family history. Reviewed medications and allergies.     Objective:   Physical Exam BP 113/69 (BP Location: Left Arm, Patient Position: Sitting, Cuff Size: Normal)   Pulse 88   Ht 5\' 2"  (1.575 m)   Wt 154 lb (69.9 kg)   LMP 03/11/2020   Breastfeeding No   BMI 28.17 kg/m UPT is +, about 9+5 weeks by LMP with EDD 12/16/20. Skin warm and dry. Neck: mid line trachea, normal thyroid, good ROM, no lymphadenopathy noted. Lungs: clear to ausculation bilaterally. Cardiovascular: regular rate and rhythm. Abdomen is soft and non tender.     Assessment:     1. Pregnancy examination or test, positive result Continue PNV  2. [redacted] weeks gestation of pregnancy   3. Encounter to determine fetal viability of pregnancy, single or unspecified fetus Return in 1 week for dating 02/13/21    Plan:     Review handout by [facility]

## 2020-05-25 ENCOUNTER — Ambulatory Visit (INDEPENDENT_AMBULATORY_CARE_PROVIDER_SITE_OTHER): Payer: Medicaid Other

## 2020-05-25 DIAGNOSIS — Z3A10 10 weeks gestation of pregnancy: Secondary | ICD-10-CM | POA: Diagnosis not present

## 2020-05-25 DIAGNOSIS — O3680X0 Pregnancy with inconclusive fetal viability, not applicable or unspecified: Secondary | ICD-10-CM | POA: Diagnosis not present

## 2020-05-25 NOTE — Progress Notes (Signed)
[place] 7+5 wks,single IUP with ys,positive fht 153 bpm,normal ovaries,crl 14.74 mm

## 2020-06-24 ENCOUNTER — Other Ambulatory Visit: Payer: Self-pay | Admitting: Obstetrics and Gynecology

## 2020-06-24 DIAGNOSIS — Z3682 Encounter for antenatal screening for nuchal translucency: Secondary | ICD-10-CM

## 2020-06-25 ENCOUNTER — Encounter: Payer: Self-pay | Admitting: Women's Health

## 2020-06-25 ENCOUNTER — Ambulatory Visit (INDEPENDENT_AMBULATORY_CARE_PROVIDER_SITE_OTHER): Payer: Medicaid Other

## 2020-06-25 ENCOUNTER — Ambulatory Visit: Payer: Medicaid Other | Admitting: *Deleted

## 2020-06-25 ENCOUNTER — Other Ambulatory Visit: Payer: Self-pay

## 2020-06-25 ENCOUNTER — Ambulatory Visit (INDEPENDENT_AMBULATORY_CARE_PROVIDER_SITE_OTHER): Payer: Medicaid Other | Admitting: Women's Health

## 2020-06-25 ENCOUNTER — Other Ambulatory Visit (HOSPITAL_COMMUNITY)
Admission: RE | Admit: 2020-06-25 | Discharge: 2020-06-25 | Disposition: A | Discharge status: Home / Self Care | Payer: Medicaid Other | Source: Ambulatory Visit | Attending: Obstetrics & Gynecology | Admitting: Obstetrics & Gynecology

## 2020-06-25 VITALS — BP 112/63 | HR 88 | Wt 155.0 lb

## 2020-06-25 DIAGNOSIS — Z3682 Encounter for antenatal screening for nuchal translucency: Secondary | ICD-10-CM

## 2020-06-25 DIAGNOSIS — Z3480 Encounter for supervision of other normal pregnancy, unspecified trimester: Secondary | ICD-10-CM | POA: Insufficient documentation

## 2020-06-25 DIAGNOSIS — Z3A12 12 weeks gestation of pregnancy: Secondary | ICD-10-CM | POA: Insufficient documentation

## 2020-06-25 DIAGNOSIS — Z3481 Encounter for supervision of other normal pregnancy, first trimester: Secondary | ICD-10-CM

## 2020-06-25 DIAGNOSIS — Z1389 Encounter for screening for other disorder: Secondary | ICD-10-CM

## 2020-06-25 DIAGNOSIS — Z331 Pregnant state, incidental: Secondary | ICD-10-CM

## 2020-06-25 DIAGNOSIS — Z8759 Personal history of other complications of pregnancy, childbirth and the puerperium: Secondary | ICD-10-CM | POA: Diagnosis not present

## 2020-06-25 DIAGNOSIS — Z3490 Encounter for supervision of normal pregnancy, unspecified, unspecified trimester: Secondary | ICD-10-CM | POA: Insufficient documentation

## 2020-06-25 NOTE — Patient Instructions (Addendum)
Michele Bates, I greatly value your feedback.  If you receive a survey following your visit with [place] today, we appreciate you taking the time to fill it out.  Thanks, [name], CNM, WHNP-BC   [facility] ([address] [place], [place] [address]) Entrance C, located off of [address] [employer] [address] valet parking   Begin taking 162mg  (two 81mg  tablets) baby aspirin daily to decrease the risk of preeclampsia during pregnancy     Nausea & Vomiting  Have saltine crackers or pretzels by your bed and eat a few bites before you raise your head out of bed in the morning  Eat small frequent meals throughout the day instead of large meals  Drink plenty of fluids throughout the day to stay hydrated, just don't drink a lot of fluids with your meals.  This can make your stomach fill up faster making you feel sick  Do not brush your teeth right after you eat  Products with real ginger are good for nausea, like ginger ale and ginger hard candy Make sure it says made with real ginger!  Sucking on sour candy like lemon heads is also good for nausea  If your prenatal vitamins make you nauseated, take them at night so you will sleep through the nausea  Sea Bands  If you feel like you need medicine for the nausea & vomiting please let know  If you are unable to keep any fluids or food down please let know   Constipation  Drink plenty of fluid, preferably water, throughout the day  Eat foods high in fiber such as fruits, vegetables, and grains  Exercise, such as walking, is a good way to keep your bowels regular  Drink warm fluids, especially warm prune juice, or decaf coffee  Eat a 1/2 cup of real oatmeal (not instant), 1/2 cup applesauce, and 1/2-1 cup warm prune juice every day  If needed, you may take Colace (docusate sodium) stool softener once or twice a day to help keep the stool soft.   If you still are having problems with constipation, you may  take Miralax once daily as needed to help keep your bowels regular.   Home Blood Pressure Monitoring for Patients   Your provider has recommended that you check your blood pressure (BP) at least once a week at home. If you do not have a blood pressure cuff at home, one will be provided for you. Contact your provider if you have not received your monitor within 1 week.   Helpful Tips for Accurate Home Blood Pressure Checks  . Don't smoke, exercise, or drink caffeine 30 minutes before checking your BP . Use the restroom before checking your BP (a full bladder can raise your pressure) . Relax in a comfortable upright chair . Feet on the ground . Left arm resting comfortably on a flat surface at the level of your heart . Legs uncrossed . Back supported . Sit quietly and don't talk . Place the cuff on your bare arm . Adjust snuggly, so that only two fingertips can fit between your skin and the top of the cuff . Check 2 readings separated by at least one minute . Keep a log of your BP readings . For a visual, please reference this diagram: http://ccnc.care/bpdiagram  Provider Name: [facility]     Phone: [phone]  Zone 1: ALL CLEAR  Continue to monitor your symptoms:  . BP reading is less  than 140 (top number) or less than 90 (bottom number)  . No right upper stomach pain . No headaches or seeing spots . No feeling nauseated or throwing up . No swelling in face and hands  Zone 2: CAUTION Call your doctor's office for any of the following:  . BP reading is greater than 140 (top number) or greater than 90 (bottom number)  . Stomach pain under your ribs in the middle or right side . Headaches or seeing spots . Feeling nauseated or throwing up . Swelling in face and hands  Zone 3: EMERGENCY  Seek immediate medical care if you have any of the following:  . BP reading is greater than160 (top number) or greater than 110 (bottom number) . Severe headaches not improving with  Tylenol . Serious difficulty catching your breath . Any worsening symptoms from Zone 2    First Trimester of Pregnancy The first trimester of pregnancy is from week 1 until the end of week 12 (months 1 through 3). A week after a sperm fertilizes an egg, the egg will implant on the wall of the uterus. This embryo will begin to develop into a baby. Genes from you and your partner are forming the baby. The female genes determine whether the baby is a boy or a girl. At 6-8 weeks, the eyes and face are formed, and the heartbeat can be seen on ultrasound. At the end of 12 weeks, all the baby's organs are formed.  Now that you are pregnant, you will want to do everything you can to have a healthy baby. Two of the most important things are to get good prenatal care and to follow your health care provider's instructions. Prenatal care is all the medical care you receive before the baby's birth. This care will help prevent, find, and treat any problems during the pregnancy and childbirth. BODY CHANGES Your body goes through many changes during pregnancy. The changes vary from woman to woman.   You may gain or lose a couple of pounds at first.  You may feel sick to your stomach (nauseous) and throw up (vomit). If the vomiting is uncontrollable, call your health care provider.  You may tire easily.  You may develop headaches that can be relieved by medicines approved by your health care provider.  You may urinate more often. Painful urination may mean you have a bladder infection.  You may develop heartburn as a result of your pregnancy.  You may develop constipation because certain hormones are causing the muscles that push waste through your intestines to slow down.  You may develop hemorrhoids or swollen, bulging veins (varicose veins).  Your breasts may begin to grow larger and become tender. Your nipples may stick out more, and the tissue that surrounds them (areola) may become darker.  Your gums  may bleed and may be sensitive to brushing and flossing.  Dark spots or blotches (chloasma, mask of pregnancy) may develop on your face. This will likely fade after the baby is born.  Your menstrual periods will stop.  You may have a loss of appetite.  You may develop cravings for certain kinds of food.  You may have changes in your emotions from day to day, such as being excited to be pregnant or being concerned that something may go wrong with the pregnancy and baby.  You may have more vivid and strange dreams.  You may have changes in your hair. These can include thickening of your hair, rapid growth, and  changes in texture. Some women also have hair loss during or after pregnancy, or hair that feels dry or thin. Your hair will most likely return to normal after your baby is born. WHAT TO EXPECT AT YOUR PRENATAL VISITS During a routine prenatal visit:  You will be weighed to make sure you and the baby are growing normally.  Your blood pressure will be taken.  Your abdomen will be measured to track your baby's growth.  The fetal heartbeat will be listened to starting around week 10 or 12 of your pregnancy.  Test results from any previous visits will be discussed. Your health care provider may ask you:  How you are feeling.  If you are feeling the baby move.  If you have had any abnormal symptoms, such as leaking fluid, bleeding, severe headaches, or abdominal cramping.  If you have any questions. Other tests that may be performed during your first trimester include:  Blood tests to find your blood type and to check for the presence of any previous infections. They will also be used to check for low iron levels (anemia) and Rh antibodies. Later in the pregnancy, blood tests for diabetes will be done along with other tests if problems develop.  Urine tests to check for infections, diabetes, or protein in the urine.  An ultrasound to confirm the proper growth and development  of the baby.  An amniocentesis to check for possible genetic problems.  Fetal screens for spina bifida and Down syndrome.  You may need other tests to make sure you and the baby are doing well. HOME CARE INSTRUCTIONS  Medicines  Follow your health care provider's instructions regarding medicine use. Specific medicines may be either safe or unsafe to take during pregnancy.  Take your prenatal vitamins as directed.  If you develop constipation, try taking a stool softener if your health care provider approves. Diet  Eat regular, well-balanced meals. Choose a variety of foods, such as meat or vegetable-based protein, fish, milk and low-fat dairy products, vegetables, fruits, and whole grain breads and cereals. Your health care provider will help you determine the amount of weight gain that is right for you.  Avoid raw meat and uncooked cheese. These carry germs that can cause birth defects in the baby.  Eating four or five small meals rather than three large meals a day may help relieve nausea and vomiting. If you start to feel nauseous, eating a few soda crackers can be helpful. Drinking liquids between meals instead of during meals also seems to help nausea and vomiting.  If you develop constipation, eat more high-fiber foods, such as fresh vegetables or fruit and whole grains. Drink enough fluids to keep your urine clear or pale yellow. Activity and Exercise  Exercise only as directed by your health care provider. Exercising will help you:  Control your weight.  Stay in shape.  Be prepared for labor and delivery.  Experiencing pain or cramping in the lower abdomen or low back is a good sign that you should stop exercising. Check with your health care provider before continuing normal exercises.  Try to avoid standing for long periods of time. Move your legs often if you must stand in one place for a long time.  Avoid heavy lifting.  Wear low-heeled shoes, and practice good  posture.  You may continue to have sex unless your health care provider directs you otherwise. Relief of Pain or Discomfort  Wear a good support bra for breast tenderness.  Take warm sitz baths to soothe any pain or discomfort caused by hemorrhoids. Use hemorrhoid cream if your health care provider approves.    Rest with your legs elevated if you have leg cramps or low back pain.  If you develop varicose veins in your legs, wear support hose. Elevate your feet for 15 minutes, 3-4 times a day. Limit salt in your diet. Prenatal Care  Schedule your prenatal visits by the twelfth week of pregnancy. They are usually scheduled monthly at first, then more often in the last 2 months before delivery.  Write down your questions. Take them to your prenatal visits.  Keep all your prenatal visits as directed by your health care provider. Safety  Wear your seat belt at all times when driving.  Make a list of emergency phone numbers, including numbers for family, friends, the hospital, and police and fire departments. General Tips  Ask your health care provider for a referral to a local prenatal education class. Begin classes no later than at the beginning of month 6 of your pregnancy.  Ask for help if you have counseling or nutritional needs during pregnancy. Your health care provider can offer advice or refer you to specialists for help with various needs.  Do not use hot tubs, steam rooms, or saunas.  Do not douche or use tampons or scented sanitary pads.  Do not cross your legs for long periods of time.  Avoid cat litter boxes and soil used by cats. These carry germs that can cause birth defects in the baby and possibly loss of the fetus by miscarriage or stillbirth.  Avoid all smoking, herbs, alcohol, and medicines not prescribed by your health care provider. Chemicals in these affect the formation and growth of the baby.  Schedule a dentist appointment. At home, brush your teeth with  a soft toothbrush and be gentle when you floss. SEEK MEDICAL CARE IF:   You have dizziness.  You have mild pelvic cramps, pelvic pressure, or nagging pain in the abdominal area.  You have persistent nausea, vomiting, or diarrhea.  You have a bad smelling vaginal discharge.  You have pain with urination.  You notice increased swelling in your face, hands, legs, or ankles. SEEK IMMEDIATE MEDICAL CARE IF:   You have a fever.  You are leaking fluid from your vagina.  You have spotting or bleeding from your vagina.  You have severe abdominal cramping or pain.  You have rapid weight gain or loss.  You vomit blood or material that looks like coffee grounds.  You are exposed to [place] measles and have never had them.  You are exposed to fifth disease or chickenpox.  You develop a severe headache.  You have shortness of breath.  You have any kind of trauma, such as from a fall or a car accident. Document Released: 10/17/2001 Document Revised: 03/09/2014 Document Reviewed: 09/02/2013 [facility] Patient Information 2015 [place], [place]. This information is not intended to replace advice given to you by your health care provider. Make sure you discuss any questions you have with your health care provider.

## 2020-06-25 NOTE — Progress Notes (Signed)
INITIAL OBSTETRICAL VISIT Patient name: Michele Bates MRN [id number]  Date of birth: 02/25/1991 Chief Complaint:   Initial Prenatal Visit  History of Present Illness:   Michele Bates is a 29 y.o. [id number] Hispanic female at [redacted]w[redacted]d by [place] at 7 weeks with an Estimated Date of Delivery: 01/06/21 being seen today for her initial obstetrical visit.   Her obstetrical history is significant for term SVB x 2, GHTN last pregnancy, SAB x 1.   Today she reports n/v-improving, declines meds.  Depression screen [facility] 2/9 06/25/2020 09/11/2018 08/26/2018  Decreased Interest 0 0 0  Down, Depressed, Hopeless 0 0 0  PHQ - 2 Score 0 0 0  Altered sleeping 0 0 -  Tired, decreased energy 0 1 -  Change in appetite 0 0 -  Feeling bad or failure about yourself  0 0 -  Trouble concentrating 0 0 -  Moving slowly or fidgety/restless 0 0 -  Suicidal thoughts 0 0 -  PHQ-9 Score 0 1 -    Patient's last menstrual period was 03/11/2020 (approximate). Last pap 2018 @ [facility]. Results were: normal Review of Systems:   Pertinent items are noted in HPI Denies cramping/contractions, leakage of fluid, vaginal bleeding, abnormal vaginal discharge w/ itching/odor/irritation, headaches, visual changes, shortness of breath, chest pain, abdominal pain, severe nausea/vomiting, or problems with urination or bowel movements unless otherwise stated above.  Pertinent History Reviewed:  Reviewed past medical,surgical, social, obstetrical and family history.  Reviewed problem list, medications and allergies. OB History  Gravida Para Term Preterm AB Living  4 2 2   1 2   SAB TAB Ectopic Multiple Live Births  1     0 2    # Outcome Date GA Lbr Len/2nd Weight Sex Delivery Anes PTL Lv  4 Current           3 Term 03/23/19 [redacted]w[redacted]d 02:45 / 00:21 7 lb 4.1 oz (3.29 kg) F Vag-Spont EPI  LIV     Complications: Gestational hypertension  2 SAB 10/22/17 [redacted]w[redacted]d         1 Term 08/18/11 104w2d  7 lb 5 oz (3.317 kg) F Vag-Spont EPI N LIV    Physical Assessment:   Vitals:   06/25/20 0912  BP: 112/63  Pulse: 88  Weight: 155 lb (70.3 kg)  Body mass index is 28.35 kg/m.       Physical Examination:  General appearance - well appearing, and in no distress  Mental status - alert, oriented to person, place, and time  Psych:  She has a normal mood and affect  Skin - warm and dry, normal color, no suspicious lesions noted  Chest - effort normal, all lung fields clear to auscultation bilaterally  Heart - normal rate and regular rhythm  Abdomen - soft, nontender  Extremities:  No swelling or varicosities noted  Pelvic - VULVA: normal appearing vulva with no masses, tenderness or lesions  VAGINA: normal appearing vagina with normal color and discharge, no lesions  CERVIX: normal appearing cervix without discharge or lesions, no CMT  Thin prep pap is done w/ HR HPV cotesting  TODAY'S NT 06-25-1986 12+1 wks,measurements c/w dates,CRL 60.98 mm,anterior placenta gr 0,normal ovaries,NB present,NT 1.5 mm,fhr 153 bpm    No results found for this or any previous visit (from the past 24 hour(s)).  Assessment & Plan:  1) Low-Risk Pregnancy [place] at [redacted]w[redacted]d with an Estimated Date of Delivery: 01/06/21   2) Initial OB visit  3) H/O GHTN> ASA 162mg , baseline  labs today  Meds: No orders of the defined types were placed in this encounter.   Initial labs obtained Continue prenatal vitamins Reviewed n/v relief measures and warning s/s to report Reviewed recommended weight gain based on pre-gravid BMI Encouraged well-balanced diet Genetic & carrier screening discussed: requests Panorama and NT/IT, declines Horizon 14  Ultrasound discussed; fetal survey: requested [facility]NC completed> form faxed if has or is planning to apply for medicaid The nature of [facility] [employer] with multiple MDs and other Advanced Practice Providers was explained to patient; also emphasized that fellows, residents, and students are part of our  team. Has home bp cuff.  Check bp weekly, let us know if >140/90.   Follow-up: Return in about 3 weeks (around 07/16/2020) for LROB, 2nd IT, in person, CNM.   Orders Placed This Encounter  Procedures  . Urine Culture  . Integrated 1  . Genetic Screening  . CBC/D/Plt+RPR+Rh+ABO+Rub Ab...  . Pain Management Screening Profile (10S)  . Protein / creatinine ratio, urine  . Comprehensive metabolic panel  . POC Urinalysis Dipstick OB    Cheral Marker CNM, [facility] 06/25/2020 9:52 AM

## 2020-06-25 NOTE — Progress Notes (Signed)
[place] 12+1 wks,measurements c/w dates,CRL 60.98 mm,anterior placenta gr 0,normal ovaries,NB present,NT 1.5 mm,fhr 153 bpm

## 2020-06-26 LAB — MED LIST OPTION NOT SELECTED

## 2020-06-27 LAB — URINE CULTURE

## 2020-06-28 ENCOUNTER — Other Ambulatory Visit: Payer: Self-pay | Admitting: Women's Health

## 2020-06-28 LAB — PMP SCREEN PROFILE (10S), URINE
Amphetamine Scrn, Ur: NEGATIVE ng/mL
BARBITURATE SCREEN URINE: NEGATIVE ng/mL
BENZODIAZEPINE SCREEN, URINE: NEGATIVE ng/mL
CANNABINOIDS UR QL SCN: NEGATIVE ng/mL
Cocaine (Metab) Scrn, Ur: NEGATIVE ng/mL
Creatinine(Crt), U: 50.3 mg/dL (ref 20.0–300.0)
Methadone Screen, Urine: NEGATIVE ng/mL
OXYCODONE+OXYMORPHONE UR QL SCN: NEGATIVE ng/mL
Opiate Scrn, Ur: NEGATIVE ng/mL
Ph of Urine: 6.7 (ref 4.5–8.9)
Phencyclidine Qn, Ur: NEGATIVE ng/mL
Propoxyphene Scrn, Ur: NEGATIVE ng/mL

## 2020-06-28 LAB — PROTEIN / CREATININE RATIO, URINE
Creatinine, Urine: 48.5 mg/dL
Protein, Ur: 11.9 mg/dL
Protein/Creat Ratio: 245 mg/g creat — ABNORMAL HIGH (ref 0–200)

## 2020-06-28 LAB — CBC/D/PLT+RPR+RH+ABO+RUB AB...
Antibody Screen: NEGATIVE
Basophils Absolute: 0 10*3/uL (ref 0.0–0.2)
Basos: 0 %
EOS (ABSOLUTE): 0.1 10*3/uL (ref 0.0–0.4)
Eos: 1 %
HCV Ab: 0.2 s/co ratio (ref 0.0–0.9)
HIV Screen 4th Generation wRfx: NONREACTIVE
Hematocrit: 40.7 % (ref 34.0–46.6)
Hemoglobin: 14 g/dL (ref 11.1–15.9)
Hepatitis B Surface Ag: NEGATIVE
Immature Grans (Abs): 0 10*3/uL (ref 0.0–0.1)
Immature Granulocytes: 0 %
Lymphocytes Absolute: 1.5 10*3/uL (ref 0.7–3.1)
Lymphs: 22 %
MCH: 31.3 pg (ref 26.6–33.0)
MCHC: 34.4 g/dL (ref 31.5–35.7)
MCV: 91 fL (ref 79–97)
Monocytes Absolute: 0.4 10*3/uL (ref 0.1–0.9)
Monocytes: 6 %
Neutrophils Absolute: 4.9 10*3/uL (ref 1.4–7.0)
Neutrophils: 71 %
Platelets: 265 10*3/uL (ref 150–450)
RBC: 4.48 x10E6/uL (ref 3.77–5.28)
RDW: 11.5 % — ABNORMAL LOW (ref 11.7–15.4)
RPR Ser Ql: NONREACTIVE
Rh Factor: POSITIVE
Rubella Antibodies, IGG: 2.42 index (ref >0.99)
WBC: 7 10*3/uL (ref 3.4–10.8)

## 2020-06-28 LAB — INTEGRATED 1
Crown Rump Length: 60.9 mm
Gest. Age on Collection Date: 12.4 weeks
Maternal Age at EDD: 28.3 yr
Nuchal Translucency (NT): 1.5 mm
Number of Fetuses: 1
PAPP-A Value: 363.3 ng/mL
Weight: 155 [lb_av]

## 2020-06-28 LAB — COMPREHENSIVE METABOLIC PANEL
ALT: 8 IU/L (ref 0–32)
AST: 11 IU/L (ref 0–40)
Albumin/Globulin Ratio: 1.8 (ref 1.2–2.2)
Albumin: 4.2 g/dL (ref 3.9–5.0)
Alkaline Phosphatase: 56 IU/L (ref 48–121)
BUN/Creatinine Ratio: 13 (ref 9–23)
BUN: 7 mg/dL (ref 6–20)
Bilirubin Total: 0.3 mg/dL (ref 0.0–1.2)
CO2: 21 mmol/L (ref 20–29)
Calcium: 9.3 mg/dL (ref 8.7–10.2)
Chloride: 102 mmol/L (ref 96–106)
Creatinine, Ser: 0.55 mg/dL — ABNORMAL LOW (ref 0.57–1.00)
GFR calc Af Amer: 149 mL/min/{1.73_m2} (ref >59)
GFR calc non Af Amer: 129 mL/min/{1.73_m2} (ref >59)
Globulin, Total: 2.4 g/dL (ref 1.5–4.5)
Glucose: 86 mg/dL (ref 65–99)
Potassium: 4.4 mmol/L (ref 3.5–5.2)
Sodium: 136 mmol/L (ref 134–144)
Total Protein: 6.6 g/dL (ref 6.0–8.5)

## 2020-06-28 LAB — CYTOLOGY - PAP
Chlamydia: NEGATIVE
Comment: NEGATIVE
Comment: NEGATIVE
Comment: NORMAL
Diagnosis: NEGATIVE
High risk HPV: NEGATIVE
Neisseria Gonorrhea: NEGATIVE

## 2020-06-28 LAB — HCV INTERPRETATION

## 2020-06-28 MED ORDER — METRONIDAZOLE 500 MG PO TABS: 500.0000 mg | ORAL_TABLET | Freq: Two times a day (BID) | ORAL | 0 refills | Status: DC

## 2020-07-15 ENCOUNTER — Ambulatory Visit (INDEPENDENT_AMBULATORY_CARE_PROVIDER_SITE_OTHER): Payer: Medicaid Other | Admitting: Advanced Practice Midwife

## 2020-07-15 ENCOUNTER — Encounter: Payer: Self-pay | Admitting: Advanced Practice Midwife

## 2020-07-15 VITALS — BP 112/66 | HR 95 | Wt 155.0 lb

## 2020-07-15 DIAGNOSIS — Z331 Pregnant state, incidental: Secondary | ICD-10-CM | POA: Diagnosis not present

## 2020-07-15 DIAGNOSIS — Z3480 Encounter for supervision of other normal pregnancy, unspecified trimester: Secondary | ICD-10-CM

## 2020-07-15 DIAGNOSIS — Z363 Encounter for antenatal screening for malformations: Secondary | ICD-10-CM

## 2020-07-15 DIAGNOSIS — Z3A15 15 weeks gestation of pregnancy: Secondary | ICD-10-CM

## 2020-07-15 DIAGNOSIS — Z1389 Encounter for screening for other disorder: Secondary | ICD-10-CM | POA: Diagnosis not present

## 2020-07-15 LAB — POCT URINALYSIS DIPSTICK OB
Blood, UA: NEGATIVE
Glucose, UA: NEGATIVE
Ketones, UA: NEGATIVE
Leukocytes, UA: NEGATIVE
Nitrite, UA: NEGATIVE
POC,PROTEIN,UA: NEGATIVE

## 2020-07-15 NOTE — Patient Instructions (Signed)
Michele Bates, I greatly value your feedback.  If you receive a survey following your visit with [place] today, we appreciate you taking the time to fill it out.  Thanks, Cathie Beams, CNM     [facility] HAS MOVED!!! It is now [facility] ([address] [place], [place] [address]) Entrance located off of [address] Free 24/7 valet parking   Go to [email or web address] to register for FREE online childbirth classes    Second Trimester of Pregnancy The second trimester is from week 14 through week 27 (months 4 through 6). The second trimester is often a time when you feel your best. Your body has adjusted to being pregnant, and you begin to feel better physically. Usually, morning sickness has lessened or quit completely, you may have more energy, and you may have an increase in appetite. The second trimester is also a time when the fetus is growing rapidly. At the end of the sixth month, the fetus is about 9 inches long and weighs about 1 pounds. You will likely begin to feel the baby move (quickening) between 16 and 20 weeks of pregnancy. Body changes during your second trimester Your body continues to go through many changes during your second trimester. The changes vary from woman to woman.  Your weight will continue to increase. You will notice your lower abdomen bulging out.  You may begin to get stretch marks on your hips, abdomen, and breasts.  You may develop headaches that can be relieved by medicines. The medicines should be approved by your health care provider.  You may urinate more often because the fetus is pressing on your bladder.  You may develop or continue to have heartburn as a result of your pregnancy.  You may develop constipation because certain hormones are causing the muscles that push waste through your intestines to slow down.  You may develop hemorrhoids or swollen, bulging veins (varicose veins).  You may have  back pain. This is caused by: ? Weight gain. ? Pregnancy hormones that are relaxing the joints in your pelvis. ? A shift in weight and the muscles that support your balance.  Your breasts will continue to grow and they will continue to become tender.  Your gums may bleed and may be sensitive to brushing and flossing.  Dark spots or blotches (chloasma, mask of pregnancy) may develop on your face. This will likely fade after the baby is born.  A dark line from your belly button to the pubic area (linea nigra) may appear. This will likely fade after the baby is born.  You may have changes in your hair. These can include thickening of your hair, rapid growth, and changes in texture. Some women also have hair loss during or after pregnancy, or hair that feels dry or thin. Your hair will most likely return to normal after your baby is born.  What to expect at prenatal visits During a routine prenatal visit:  You will be weighed to make sure you and the fetus are growing normally.  Your blood pressure will be taken.  Your abdomen will be measured to track your baby's growth.  The fetal heartbeat will be listened to.  Any test results from the previous visit will be discussed.  Your health care provider may ask you:  How you are feeling.  If you are feeling the baby move.  If you have had any abnormal symptoms, such as leaking fluid, bleeding, severe headaches, or  abdominal cramping.  If you are using any tobacco products, including cigarettes, chewing tobacco, and electronic cigarettes.  If you have any questions.  Other tests that may be performed during your second trimester include:  Blood tests that check for: ? Low iron levels (anemia). ? High blood sugar that affects pregnant women (gestational diabetes) between 24 and 28 weeks. ? Rh antibodies. This is to check for a protein on red blood cells (Rh factor).  Urine tests to check for infections, diabetes, or protein in  the urine.  An ultrasound to confirm the proper growth and development of the baby.  An amniocentesis to check for possible genetic problems.  Fetal screens for spina bifida and Down syndrome.  HIV (human immunodeficiency virus) testing. Routine prenatal testing includes screening for HIV, unless you choose not to have this test.  Follow these instructions at home: Medicines  Follow your health care provider's instructions regarding medicine use. Specific medicines may be either safe or unsafe to take during pregnancy.  Take a prenatal vitamin that contains at least 600 micrograms (mcg) of folic acid.  If you develop constipation, try taking a stool softener if your health care provider approves. Eating and drinking  Eat a balanced diet that includes fresh fruits and vegetables, whole grains, good sources of protein such as meat, eggs, or tofu, and low-fat dairy. Your health care provider will help you determine the amount of weight gain that is right for you.  Avoid raw meat and uncooked cheese. These carry germs that can cause birth defects in the baby.  If you have low calcium intake from food, talk to your health care provider about whether you should take a daily calcium supplement.  Limit foods that are high in fat and processed sugars, such as fried and sweet foods.  To prevent constipation: ? Drink enough fluid to keep your urine clear or pale yellow. ? Eat foods that are high in fiber, such as fresh fruits and vegetables, whole grains, and beans. Activity  Exercise only as directed by your health care provider. Most women can continue their usual exercise routine during pregnancy. Try to exercise for 30 minutes at least 5 days a week. Stop exercising if you experience uterine contractions.  Avoid heavy lifting, wear low heel shoes, and practice good posture.  A sexual relationship may be continued unless your health care provider directs you otherwise. Relieving pain  and discomfort  Wear a good support bra to prevent discomfort from breast tenderness.  Take warm sitz baths to soothe any pain or discomfort caused by hemorrhoids. Use hemorrhoid cream if your health care provider approves.  Rest with your legs elevated if you have leg cramps or low back pain.  If you develop varicose veins, wear support hose. Elevate your feet for 15 minutes, 3-4 times a day. Limit salt in your diet. Prenatal Care  Write down your questions. Take them to your prenatal visits.  Keep all your prenatal visits as told by your health care provider. This is important. Safety  Wear your seat belt at all times when driving.  Make a list of emergency phone numbers, including numbers for family, friends, the hospital, and police and fire departments. General instructions  Ask your health care provider for a referral to a local prenatal education class. Begin classes no later than the beginning of month 6 of your pregnancy.  Ask for help if you have counseling or nutritional needs during pregnancy. Your health care provider can offer advice or   refer you to specialists for help with various needs.  Do not use hot tubs, steam rooms, or saunas.  Do not douche or use tampons or scented sanitary pads.  Do not cross your legs for long periods of time.  Avoid cat litter boxes and soil used by cats. These carry germs that can cause birth defects in the baby and possibly loss of the fetus by miscarriage or stillbirth.  Avoid all smoking, herbs, alcohol, and unprescribed drugs. Chemicals in these products can affect the formation and growth of the baby.  Do not use any products that contain nicotine or tobacco, such as cigarettes and e-cigarettes. If you need help quitting, ask your health care provider.  Visit your dentist if you have not gone yet during your pregnancy. Use a soft toothbrush to brush your teeth and be gentle when you floss. Contact a health care provider  if:  You have dizziness.  You have mild pelvic cramps, pelvic pressure, or nagging pain in the abdominal area.  You have persistent nausea, vomiting, or diarrhea.  You have a bad smelling vaginal discharge.  You have pain when you urinate. Get help right away if:  You have a fever.  You are leaking fluid from your vagina.  You have spotting or bleeding from your vagina.  You have severe abdominal cramping or pain.  You have rapid weight gain or weight loss.  You have shortness of breath with chest pain.  You notice sudden or extreme swelling of your face, hands, ankles, feet, or legs.  You have not felt your baby move in over an hour.  You have severe headaches that do not go away when you take medicine.  You have vision changes. Summary  The second trimester is from week 14 through week 27 (months 4 through 6). It is also a time when the fetus is growing rapidly.  Your body goes through many changes during pregnancy. The changes vary from woman to woman.  Avoid all smoking, herbs, alcohol, and unprescribed drugs. These chemicals affect the formation and growth your baby.  Do not use any tobacco products, such as cigarettes, chewing tobacco, and e-cigarettes. If you need help quitting, ask your health care provider.  Contact your health care provider if you have any questions. Keep all prenatal visits as told by your health care provider. This is important. This information is not intended to replace advice given to you by your health care provider. Make sure you discuss any questions you have with your health care provider.

## 2020-07-15 NOTE — Progress Notes (Signed)
   LOW-RISK PREGNANCY VISIT Patient name: Michele Bates MRN [id number]  Date of birth: May 03, 1991 Chief Complaint:   Routine Prenatal Visit (2nd IT)  History of Present Illness:   Michele Bates is a 29 y.o. [id number] female at [redacted]w[redacted]d with an Estimated Date of Delivery: 01/06/21 being seen today for ongoing management of a low-risk pregnancy.  Today she reports no complaints. Contractions: Not present. Vag. Bleeding: None.  Movement: Present. denies leaking of fluid. Review of Systems:   Pertinent items are noted in HPI Denies abnormal vaginal discharge w/ itching/odor/irritation, headaches, visual changes, shortness of breath, chest pain, abdominal pain, severe nausea/vomiting, or problems with urination or bowel movements unless otherwise stated above. Pertinent History Reviewed:  Reviewed past medical,surgical, social, obstetrical and family history.  Reviewed problem list, medications and allergies. Physical Assessment:   Vitals:   07/15/20 1039  BP: 112/66  Pulse: 95  Weight: 155 lb (70.3 kg)  Body mass index is 28.35 kg/m.        Physical Examination:   General appearance: Well appearing, and in no distress  Mental status: Alert, oriented to person, place, and time  Skin: Warm & dry  Cardiovascular: Normal heart rate noted  Respiratory: Normal respiratory effort, no distress  Abdomen: Soft, gravid, nontender  Pelvic: Cervical exam deferred         Extremities:    Fetal Status: Fetal Heart Rate (bpm): 150   Movement: Present    Chaperone: n/a    Results for orders placed or performed in visit on 07/15/20 (from the past 24 hour(s))  POC Urinalysis Dipstick OB   Collection Time: 07/15/20 10:41 AM  Result Value Ref Range   Color, UA     Clarity, UA     Glucose, UA Negative Negative   Bilirubin, UA     Ketones, UA neg    Spec Grav, UA     Blood, UA neg    pH, UA     POC,PROTEIN,UA Negative Negative, Trace, Small (1+), Moderate (2+), Large (3+), 4+   Urobilinogen,  UA     Nitrite, UA neg    Leukocytes, UA Negative Negative   Appearance     Odor      Assessment & Plan:  1) Low-risk pregnancy [id number] at [redacted]w[redacted]d with an Estimated Date of Delivery: 01/06/21   2) Hx GHTN, ASA 162mg    Meds: No orders of the defined types were placed in this encounter.  Labs/procedures today: 2nd IT  Plan:  Continue routine obstetrical care  Next visit: prefers will be in person for anatomy scan    Reviewed:  general obstetric precautions including but not limited to vaginal bleeding, contractions, leaking of fluid and fetal movement were reviewed in detail with the patient.  All questions were answered. Has home bp cuff. Check bp weekly, let know if >140/90.   Follow-up: Return in about 4 weeks (around 08/12/2020) for 10/12/2020, LROB.  Orders Placed This Encounter  Procedures  . BZ:JIRCVEL OB Comp + 14 Wk  . INTEGRATED 2  . POC Urinalysis Dipstick OB   US DNP, CNM 07/15/2020 11:07 AM

## 2020-07-17 LAB — INTEGRATED 2
AFP MoM: 1.34
Alpha-Fetoprotein: 38.6 ng/mL
Crown Rump Length: 60.9 mm
DIA MoM: 0.82
DIA Value: 133.2 pg/mL
Estriol, Unconjugated: 0.84 ng/mL
Gest. Age on Collection Date: 12.4 weeks
Gestational Age: 15.3 weeks
Maternal Age at EDD: 28.3 yr
Nuchal Translucency (NT): 1.5 mm
Nuchal Translucency MoM: 1.1
Number of Fetuses: 1
PAPP-A MoM: 0.39
PAPP-A Value: 363.3 ng/mL
Test Results:: NEGATIVE
Weight: 155 [lb_av]
Weight: 155 [lb_av]
hCG MoM: 0.63
hCG Value: 27.1 IU/mL
uE3 MoM: 1.06

## 2020-08-10 ENCOUNTER — Encounter: Payer: Self-pay | Admitting: *Deleted

## 2020-08-10 DIAGNOSIS — Z3480 Encounter for supervision of other normal pregnancy, unspecified trimester: Secondary | ICD-10-CM

## 2020-08-12 ENCOUNTER — Other Ambulatory Visit: Payer: Self-pay

## 2020-08-12 ENCOUNTER — Encounter: Payer: Self-pay | Admitting: Advanced Practice Midwife

## 2020-08-12 ENCOUNTER — Ambulatory Visit (INDEPENDENT_AMBULATORY_CARE_PROVIDER_SITE_OTHER): Payer: Medicaid Other | Admitting: Advanced Practice Midwife

## 2020-08-12 VITALS — BP 103/62 | HR 94 | Wt 159.5 lb

## 2020-08-12 DIAGNOSIS — Z1389 Encounter for screening for other disorder: Secondary | ICD-10-CM

## 2020-08-12 DIAGNOSIS — Z3480 Encounter for supervision of other normal pregnancy, unspecified trimester: Secondary | ICD-10-CM

## 2020-08-12 DIAGNOSIS — Z3482 Encounter for supervision of other normal pregnancy, second trimester: Secondary | ICD-10-CM

## 2020-08-12 DIAGNOSIS — Z331 Pregnant state, incidental: Secondary | ICD-10-CM

## 2020-08-12 DIAGNOSIS — Z3A19 19 weeks gestation of pregnancy: Secondary | ICD-10-CM

## 2020-08-12 LAB — POCT URINALYSIS DIPSTICK OB
Blood, UA: NEGATIVE
Glucose, UA: NEGATIVE
Ketones, UA: NEGATIVE
Nitrite, UA: NEGATIVE
POC,PROTEIN,UA: NEGATIVE

## 2020-08-12 NOTE — Patient Instructions (Addendum)
Michele Bates, I greatly value your feedback.  If you receive a survey following your visit with [place] today, we appreciate you taking the time to fill it out.  Thanks, Cathie Beams, CNM     [facility] HAS MOVED!!! It is now [facility] ([address] [place], [place] [address]) Entrance located off of [address] Free 24/7 valet parking   Go to [email or web address] to register for FREE online childbirth classes    Second Trimester of Pregnancy The second trimester is from week 14 through week 27 (months 4 through 6). The second trimester is often a time when you feel your best. Your body has adjusted to being pregnant, and you begin to feel better physically. Usually, morning sickness has lessened or quit completely, you may have more energy, and you may have an increase in appetite. The second trimester is also a time when the fetus is growing rapidly. At the end of the sixth month, the fetus is about 9 inches long and weighs about 1 pounds. You will likely begin to feel the baby move (quickening) between 16 and 20 weeks of pregnancy. Body changes during your second trimester Your body continues to go through many changes during your second trimester. The changes vary from woman to woman.  Your weight will continue to increase. You will notice your lower abdomen bulging out.  You may begin to get stretch marks on your hips, abdomen, and breasts.  You may develop headaches that can be relieved by medicines. The medicines should be approved by your health care provider.  You may urinate more often because the fetus is pressing on your bladder.  You may develop or continue to have heartburn as a result of your pregnancy.  You may develop constipation because certain hormones are causing the muscles that push waste through your intestines to slow down.  You may develop hemorrhoids or swollen, bulging veins (varicose veins).  You may have  back pain. This is caused by: ? Weight gain. ? Pregnancy hormones that are relaxing the joints in your pelvis. ? A shift in weight and the muscles that support your balance.  Your breasts will continue to grow and they will continue to become tender.  Your gums may bleed and may be sensitive to brushing and flossing.  Dark spots or blotches (chloasma, mask of pregnancy) may develop on your face. This will likely fade after the baby is born.  A dark line from your belly button to the pubic area (linea nigra) may appear. This will likely fade after the baby is born.  You may have changes in your hair. These can include thickening of your hair, rapid growth, and changes in texture. Some women also have hair loss during or after pregnancy, or hair that feels dry or thin. Your hair will most likely return to normal after your baby is born.  What to expect at prenatal visits During a routine prenatal visit:  You will be weighed to make sure you and the fetus are growing normally.  Your blood pressure will be taken.  Your abdomen will be measured to track your baby's growth.  The fetal heartbeat will be listened to.  Any test results from the previous visit will be discussed.  Your health care provider may ask you:  How you are feeling.  If you are feeling the baby move.  If you have had any abnormal symptoms, such as leaking fluid, bleeding, severe headaches, or  abdominal cramping.  If you are using any tobacco products, including cigarettes, chewing tobacco, and electronic cigarettes.  If you have any questions.  Other tests that may be performed during your second trimester include:  Blood tests that check for: ? Low iron levels (anemia). ? High blood sugar that affects pregnant women (gestational diabetes) between 24 and 28 weeks. ? Rh antibodies. This is to check for a protein on red blood cells (Rh factor).  Urine tests to check for infections, diabetes, or protein in  the urine.  An ultrasound to confirm the proper growth and development of the baby.  An amniocentesis to check for possible genetic problems.  Fetal screens for spina bifida and Down syndrome.  HIV (human immunodeficiency virus) testing. Routine prenatal testing includes screening for HIV, unless you choose not to have this test.  Follow these instructions at home: Medicines  Follow your health care provider's instructions regarding medicine use. Specific medicines may be either safe or unsafe to take during pregnancy.  Take a prenatal vitamin that contains at least 600 micrograms (mcg) of folic acid.  If you develop constipation, try taking a stool softener if your health care provider approves. Eating and drinking  Eat a balanced diet that includes fresh fruits and vegetables, whole grains, good sources of protein such as meat, eggs, or tofu, and low-fat dairy. Your health care provider will help you determine the amount of weight gain that is right for you.  Avoid raw meat and uncooked cheese. These carry germs that can cause birth defects in the baby.  If you have low calcium intake from food, talk to your health care provider about whether you should take a daily calcium supplement.  Limit foods that are high in fat and processed sugars, such as fried and sweet foods.  To prevent constipation: ? Drink enough fluid to keep your urine clear or pale yellow. ? Eat foods that are high in fiber, such as fresh fruits and vegetables, whole grains, and beans. Activity  Exercise only as directed by your health care provider. Most women can continue their usual exercise routine during pregnancy. Try to exercise for 30 minutes at least 5 days a week. Stop exercising if you experience uterine contractions.  Avoid heavy lifting, wear low heel shoes, and practice good posture.  A sexual relationship may be continued unless your health care provider directs you otherwise. Relieving pain  and discomfort  Wear a good support bra to prevent discomfort from breast tenderness.  Take warm sitz baths to soothe any pain or discomfort caused by hemorrhoids. Use hemorrhoid cream if your health care provider approves.  Rest with your legs elevated if you have leg cramps or low back pain.  If you develop varicose veins, wear support hose. Elevate your feet for 15 minutes, 3-4 times a day. Limit salt in your diet. Prenatal Care  Write down your questions. Take them to your prenatal visits.  Keep all your prenatal visits as told by your health care provider. This is important. Safety  Wear your seat belt at all times when driving.  Make a list of emergency phone numbers, including numbers for family, friends, the hospital, and police and fire departments. General instructions  Ask your health care provider for a referral to a local prenatal education class. Begin classes no later than the beginning of month 6 of your pregnancy.  Ask for help if you have counseling or nutritional needs during pregnancy. Your health care provider can offer advice or   refer you to specialists for help with various needs.  Do not use hot tubs, steam rooms, or saunas.  Do not douche or use tampons or scented sanitary pads.  Do not cross your legs for long periods of time.  Avoid cat litter boxes and soil used by cats. These carry germs that can cause birth defects in the baby and possibly loss of the fetus by miscarriage or stillbirth.  Avoid all smoking, herbs, alcohol, and unprescribed drugs. Chemicals in these products can affect the formation and growth of the baby.  Do not use any products that contain nicotine or tobacco, such as cigarettes and e-cigarettes. If you need help quitting, ask your health care provider.  Visit your dentist if you have not gone yet during your pregnancy. Use a soft toothbrush to brush your teeth and be gentle when you floss. Contact a health care provider  if:  You have dizziness.  You have mild pelvic cramps, pelvic pressure, or nagging pain in the abdominal area.  You have persistent nausea, vomiting, or diarrhea.  You have a bad smelling vaginal discharge.  You have pain when you urinate. Get help right away if:  You have a fever.  You are leaking fluid from your vagina.  You have spotting or bleeding from your vagina.  You have severe abdominal cramping or pain.  You have rapid weight gain or weight loss.  You have shortness of breath with chest pain.  You notice sudden or extreme swelling of your face, hands, ankles, feet, or legs.  You have not felt your baby move in over an hour.  You have severe headaches that do not go away when you take medicine.  You have vision changes. Summary  The second trimester is from week 14 through week 27 (months 4 through 6). It is also a time when the fetus is growing rapidly.  Your body goes through many changes during pregnancy. The changes vary from woman to woman.  Avoid all smoking, herbs, alcohol, and unprescribed drugs. These chemicals affect the formation and growth your baby.  Do not use any tobacco products, such as cigarettes, chewing tobacco, and e-cigarettes. If you need help quitting, ask your health care provider.  Contact your health care provider if you have any questions. Keep all prenatal visits as told by your health care provider. This is important. This information is not intended to replace advice given to you by your health care provider. Make sure you discuss any questions you have with your health care provider.        Intrauterine Device Information An intrauterine device (IUD) is a medical device that is inserted in the uterus to prevent pregnancy. It is a small, T-shaped device that has one or two nylon strings hanging down from it. The strings hang out of the lower part of the uterus (cervix) to allow for future IUD removal. There are two types of  IUDs available:  Hormone IUD. This type of IUD is made of plastic and contains the hormone progestin (synthetic progesterone). A hormone IUD may last 3-5 years.  Copper IUD. This type of IUD has copper wire wrapped around it. A copper IUD may last up to 10 years. How is an IUD inserted? An IUD is inserted through the vagina and placed into the uterus with a minor medical procedure. The exact procedure for IUD insertion may vary among health care providers and hospitals. How does an IUD work? Synthetic progesterone in a hormonal IUD prevents pregnancy  by:  Thickening cervical mucus to prevent sperm from entering the uterus.  Thinning the uterine lining to prevent a fertilized egg from being implanted there. Copper in a copper IUD prevents pregnancy by making the uterus and fallopian tubes produce a fluid that kills sperm. What are the advantages of an IUD? Advantages of either type of IUD  It is highly effective in preventing pregnancy.  It is reversible. You can become pregnant shortly after the IUD is removed.  It is low-maintenance and can stay in place for a long time.  There are no estrogen-related side effects.  It can be used when breastfeeding.  It is not associated with weight gain.  It can be inserted right after childbirth, an abortion, or a miscarriage. Advantages of a hormone IUD  If it is inserted within 7 days of your period starting, it works right after it is inserted. If the hormone IUD is inserted at any other time in your cycle, you will need to use a backup method of birth control for 7 days after insertion.  It can make menstrual periods lighter.  It can reduce menstrual cramping.  It can be used for 3-5 years. Advantages of a copper IUD  It works right after it is inserted.  It can be used as a form of emergency birth control if it is inserted within 5 days after having unprotected sex.  It does not interfere with your body's natural hormones.  It  can be used for 10 years. What are the disadvantages of an IUD?  An IUD may cause irregular menstrual bleeding for a period of time after insertion.  You may have pain during insertion and have cramping and vaginal bleeding after insertion.  An IUD may cut the uterus (uterine perforation) when it is inserted. This is rare.  An IUD may cause pelvic inflammatory disease (PID), which is an infection in the uterus and fallopian tubes. This is rare, and it usually happens during the first 20 days after the IUD is inserted.  A copper IUD can make your menstrual flow heavier and more painful. How is an IUD removed?  You will lie on your back with your knees bent and your feet in footrests (stirrups).  A device will be inserted into your vagina to spread apart the vaginal walls (speculum). This will allow your health care provider to see the strings attached to the IUD.  Your health care provider will use a small instrument (forceps) to grasp the IUD strings and pull firmly until the IUD is removed. You may have some discomfort when the IUD is removed. Your health care provider may recommend taking over-the-counter pain relievers, such as ibuprofen, before the procedure. You may also have minor spotting for a few days after the procedure. The exact procedure for IUD removal may vary among health care providers and hospitals. Is the IUD right for me? Your health care provider will make sure you are a good candidate for an IUD and will discuss the advantages, disadvantages, and possible side effects with you. Summary  An intrauterine device (IUD) is a medical device that is inserted in the uterus to prevent pregnancy. It is a small, T-shaped device that has one or two nylon strings hanging down from it.  A hormone IUD contains the hormone progestin (synthetic progesterone). A copper IUD has copper wire wrapped around it.  Synthetic progesterone in a hormone IUD prevents pregnancy by thickening  cervical mucus and thinning the walls of the uterus. Copper  in a copper IUD prevents pregnancy by making the uterus and fallopian tubes produce a fluid that kills sperm.  A hormone IUD can be left in place for 3-5 years. A copper IUD can be left in place for up to 10 years.  An IUD is inserted and removed by a health care provider. You may feel some pain during insertion and removal. Your health care provider may recommend taking over-the-counter pain medicine, such as ibuprofen, before an IUD procedure. This information is not intended to replace advice given to you by your health care provider. Make sure you discuss any questions you have with your health care provider. Document Revised: 10/05/2017 Document Reviewed: 11/21/2016 Elsevier Patient Education  2020 [employer].

## 2020-08-12 NOTE — Progress Notes (Signed)
   LOW-RISK PREGNANCY VISIT Patient name: Michele Bates MRN [id number]  Date of birth: 04/30/1991 Chief Complaint:   Routine Prenatal Visit  History of Present Illness:   Michele Bates is a 29 y.o. [id number] female at [redacted]w[redacted]d with an Estimated Date of Delivery: 01/06/21 being seen today for ongoing management of a low-risk pregnancy.  Today she reports no complaints. Contractions: Not present. Vag. Bleeding: None.  Movement: Present. denies leaking of fluid. Review of Systems:   Pertinent items are noted in HPI Denies abnormal vaginal discharge w/ itching/odor/irritation, headaches, visual changes, shortness of breath, chest pain, abdominal pain, severe nausea/vomiting, or problems with urination or bowel movements unless otherwise stated above. Pertinent History Reviewed:  Reviewed past medical,surgical, social, obstetrical and family history.  Reviewed problem list, medications and allergies. Physical Assessment:   Vitals:   08/12/20 0941  BP: 103/62  Pulse: 94  Weight: 159 lb 8 oz (72.3 kg)  Body mass index is 29.17 kg/m.        Physical Examination:   General appearance: Well appearing, and in no distress  Mental status: Alert, oriented to person, place, and time  Skin: Warm & dry  Cardiovascular: Normal heart rate noted  Respiratory: Normal respiratory effort, no distress  Abdomen: Soft, gravid, nontender  Pelvic: Cervical exam deferred         Extremities: Edema: None  Fetal Status: Fetal Heart Rate (bpm): 144   Movement: Present    Chaperone: n/a    Results for orders placed or performed in visit on 08/12/20 (from the past 24 hour(s))  POC Urinalysis Dipstick OB   Collection Time: 08/12/20  9:42 AM  Result Value Ref Range   Color, UA     Clarity, UA     Glucose, UA Negative Negative   Bilirubin, UA     Ketones, UA neg    Spec Grav, UA     Blood, UA neg    pH, UA     POC,PROTEIN,UA Negative Negative, Trace, Small (1+), Moderate (2+), Large (3+), 4+    Urobilinogen, UA     Nitrite, UA neg    Leukocytes, UA Trace (A) Negative   Appearance     Odor      Assessment & Plan:  1) Low-risk pregnancy [id number] at [redacted]w[redacted]d with an Estimated Date of Delivery: 01/06/21     Meds: No orders of the defined types were placed in this encounter.  Labs/procedures today: anatomy scan  Plan:  Continue routine obstetrical care  Next visit: prefers online    Reviewed: Preterm labor symptoms and general obstetric precautions including but not limited to vaginal bleeding, contractions, leaking of fluid and fetal movement were reviewed in detail with the patient.  All questions were answered. Has home bp cuff. Check bp weekly, let us know if >140/90.   Follow-up: Return in about 4 weeks (around 09/09/2020) for [facility] My[facility] visit.  Orders Placed This Encounter  Procedures  . POC Urinalysis Dipstick OB   Jacklyn Shell DNP, CNM 08/12/2020 9:59 AM

## 2020-08-13 ENCOUNTER — Ambulatory Visit (INDEPENDENT_AMBULATORY_CARE_PROVIDER_SITE_OTHER): Payer: Medicaid Other

## 2020-08-13 DIAGNOSIS — Z3480 Encounter for supervision of other normal pregnancy, unspecified trimester: Secondary | ICD-10-CM

## 2020-08-13 DIAGNOSIS — Z363 Encounter for antenatal screening for malformations: Secondary | ICD-10-CM

## 2020-08-13 NOTE — Progress Notes (Signed)
[place] 19+1 wks,cephalic,anterior placenta gr 0,normal ovaries,cx 4.9 cm,svp of fluid 4.7 cm,fhr 146 bpm,,EFW 326 g 89%,anatomy complete,no obvious abnormalities

## 2020-09-09 ENCOUNTER — Ambulatory Visit (INDEPENDENT_AMBULATORY_CARE_PROVIDER_SITE_OTHER): Payer: Medicaid Other | Admitting: Advanced Practice Midwife

## 2020-09-09 ENCOUNTER — Encounter: Payer: Self-pay | Admitting: Advanced Practice Midwife

## 2020-09-09 VITALS — BP 104/65 | HR 94 | Wt 165.4 lb

## 2020-09-09 DIAGNOSIS — R102 Pelvic and perineal pain: Secondary | ICD-10-CM | POA: Diagnosis not present

## 2020-09-09 DIAGNOSIS — Z1389 Encounter for screening for other disorder: Secondary | ICD-10-CM

## 2020-09-09 DIAGNOSIS — Z3480 Encounter for supervision of other normal pregnancy, unspecified trimester: Secondary | ICD-10-CM

## 2020-09-09 DIAGNOSIS — Z23 Encounter for immunization: Secondary | ICD-10-CM

## 2020-09-09 DIAGNOSIS — Z331 Pregnant state, incidental: Secondary | ICD-10-CM

## 2020-09-09 LAB — POCT URINALYSIS DIPSTICK OB
Blood, UA: NEGATIVE
Glucose, UA: NEGATIVE
Ketones, UA: NEGATIVE
Leukocytes, UA: NEGATIVE
Nitrite, UA: NEGATIVE
POC,PROTEIN,UA: NEGATIVE

## 2020-09-09 NOTE — Patient Instructions (Addendum)
1. Before your test, do not eat or drink anything for 8-10 hours prior to your  appointment (a small amount of water is allowed and you may take any medicines you normally take). Be sure to drink lots of water the day before. 2. When you arrive, your blood will be drawn for a fasting blood sugar level.  Then you will be given a sweetened carbonated beverage to drink. You should  complete drinking this beverage within five minutes. After finishing the  beverage, you will have your blood drawn exactly 1 and 2 hours later. Having  your blood drawn on time is an important part of this test. A total of three blood  samples will be done. 3. The test takes approximately 2  hours. During the test, do not have anything to  eat or drink. Do not smoke, chew gum (not even sugarless gum) or use breath mints.  4. During the test you should remain close by and seated as much as possible and  avoid walking around. You may want to bring a book or something else to  occupy your time.  5. After your test, you may eat and drink as normal. You may want to bring a snack  to eat after the test is finished. Your provider will advise you as to the results of  this test and any follow-up if necessary  If your sugar test is positive for gestational diabetes, you will be given an phone call and further instructions discussed. If you wish to know all of your test results before your next appointment, feel free to call the office, or look up your test results on Mychart.  (The range that the lab uses for normal values of the sugar test are not necessarily the range that is used for pregnant women; if your results are within the normal range, they are definitely normal.  However, if a value is deemed "high" by the lab, it may not be too high for a pregnant woman.  We will need to discuss the results if your value(s) fall in the "high" category).     Tdap Vaccine  It is recommended that you get the Tdap vaccine during the  third trimester of EACH pregnancy to help protect your baby from getting pertussis (whooping cough)  27-36 weeks is the BEST time to do this so that you can pass the protection on to your baby. During pregnancy is better than after pregnancy, but if you are unable to get it during pregnancy it will be offered at the hospital.  You will be offered this vaccine in the office after 27 weeks.  If you do not have health insurance, you can get the vaccine from the [facility] (no appointment needed).   Everyone who will be around your baby should also be up-to-date on their vaccines. Adults (who are not pregnant) only need 1 dose of Tdap during adulthood.   Take blood pressure about 1/x week. Let us know if it's more than 140/90

## 2020-09-09 NOTE — Progress Notes (Signed)
° °  LOW-RISK PREGNANCY VISIT Patient name: Michele Bates MRN [id number]  Date of birth: 19-Oct-1991 Chief Complaint:   Routine Prenatal Visit  History of Present Illness:   Michele Bates is a 29 y.o. [id number] female at [redacted]w[redacted]d with an Estimated Date of Delivery: 01/06/21 being seen today for ongoing management of a low-risk pregnancy.  Today she reports some RLQ pain that can swoop around to the hips/back for 2 days.. Contractions: Not present.  .  Movement: Present. denies leaking of fluid. Review of Systems:   Pertinent items are noted in HPI Denies abnormal vaginal discharge w/ itching/odor/irritation, headaches, visual changes, shortness of breath, chest pain, abdominal pain, severe nausea/vomiting, or problems with urination or bowel movements unless otherwise stated above. Pertinent History Reviewed:  Reviewed past medical,surgical, social, obstetrical and family history.  Reviewed problem list, medications and allergies. Physical Assessment:   Vitals:   09/09/20 0951  BP: 104/65  Pulse: 94  Weight: 165 lb 6.4 oz (75 kg)  Body mass index is 30.25 kg/m.        Physical Examination:   General appearance: Well appearing, and in no distress  Mental status: Alert, oriented to person, place, and time  Skin: Warm & dry  Cardiovascular: Normal heart rate noted  Respiratory: Normal respiratory effort, no distress  Abdomen: Soft, gravid, nontender.  No guarding or rebound tenderness  Pelvic: Cervical exam deferred         Extremities: Edema: None  Fetal Status:     Movement: Present    Chaperone: n/a    Results for orders placed or performed in visit on 09/09/20 (from the past 24 hour(s))  POC Urinalysis Dipstick OB   Collection Time: 09/09/20  9:59 AM  Result Value Ref Range   Color, UA     Clarity, UA     Glucose, UA Negative Negative   Bilirubin, UA     Ketones, UA neg    Spec Grav, UA     Blood, UA neg    pH, UA     POC,PROTEIN,UA Negative Negative, Trace, Small  (1+), Moderate (2+), Large (3+), 4+   Urobilinogen, UA     Nitrite, UA neg    Leukocytes, UA Negative Negative   Appearance     Odor      Assessment & Plan:  1) Low-risk pregnancy [id number] at [redacted]w[redacted]d with an Estimated Date of Delivery: 01/06/21   2) RLQ pain, probably round ligament, will cx urine; fever precautions given   Meds: No orders of the defined types were placed in this encounter.  Labs/procedures today: urine cx, flu vaccine Plan:  Continue routine obstetrical care  Next visit: prefers will be in person for gtt    Reviewed: Preterm labor symptoms and general obstetric precautions including but not limited to vaginal bleeding, contractions, leaking of fluid and fetal movement were reviewed in detail with the patient.  All questions were answered. Has home bp cuff. Check bp weekly, let us know if >140/90.   Follow-up: Return in about 4 weeks (around 10/07/2020) for PN2/LROB.  Orders Placed This Encounter  Procedures   Urine Culture   POC Urinalysis Dipstick OB   Jacklyn Shell DNP, CNM 09/09/2020 10:13 AM

## 2020-09-12 LAB — URINE CULTURE

## 2020-10-07 ENCOUNTER — Other Ambulatory Visit: Payer: Medicaid Other

## 2020-10-07 ENCOUNTER — Encounter: Payer: Medicaid Other | Admitting: Advanced Practice Midwife

## 2020-10-15 ENCOUNTER — Encounter: Payer: Self-pay | Admitting: Obstetrics & Gynecology

## 2020-10-15 ENCOUNTER — Other Ambulatory Visit: Payer: Self-pay

## 2020-10-15 ENCOUNTER — Ambulatory Visit (INDEPENDENT_AMBULATORY_CARE_PROVIDER_SITE_OTHER): Payer: Medicaid Other | Admitting: Obstetrics & Gynecology

## 2020-10-15 ENCOUNTER — Other Ambulatory Visit: Payer: Medicaid Other

## 2020-10-15 VITALS — BP 109/68 | HR 95 | Wt 169.0 lb

## 2020-10-15 DIAGNOSIS — Z331 Pregnant state, incidental: Secondary | ICD-10-CM

## 2020-10-15 DIAGNOSIS — Z1389 Encounter for screening for other disorder: Secondary | ICD-10-CM

## 2020-10-15 DIAGNOSIS — Z3A28 28 weeks gestation of pregnancy: Secondary | ICD-10-CM

## 2020-10-15 DIAGNOSIS — Z3480 Encounter for supervision of other normal pregnancy, unspecified trimester: Secondary | ICD-10-CM | POA: Diagnosis not present

## 2020-10-15 DIAGNOSIS — Z23 Encounter for immunization: Secondary | ICD-10-CM | POA: Diagnosis not present

## 2020-10-15 LAB — POCT URINALYSIS DIPSTICK OB
Blood, UA: NEGATIVE
Ketones, UA: NEGATIVE
Nitrite, UA: NEGATIVE
POC,PROTEIN,UA: NEGATIVE

## 2020-10-15 NOTE — Progress Notes (Signed)
LOW-RISK PREGNANCY VISIT Patient name: Michele Bates MRN [id number]  Date of birth: 06-20-1991 Chief Complaint:   Routine Prenatal Visit (PN2 today)  History of Present Illness:   Michele Bates is a 29 y.o. [phone] female at [redacted]w[redacted]d with an Estimated Date of Delivery: 01/06/21 being seen today for ongoing management of a low-risk pregnancy.  Depression screen [facility] 2/9 10/15/2020 06/25/2020 09/11/2018 08/26/2018  Decreased Interest 0 0 0 0  Down, Depressed, Hopeless 0 0 0 0  PHQ - 2 Score 0 0 0 0  Altered sleeping 0 0 0 -  Tired, decreased energy 0 0 1 -  Change in appetite 0 0 0 -  Feeling bad or failure about yourself  0 0 0 -  Trouble concentrating 0 0 0 -  Moving slowly or fidgety/restless 0 0 0 -  Suicidal thoughts 0 0 0 -  PHQ-9 Score 0 0 1 -    Today she reports no complaints. Contractions: Not present. Vag. Bleeding: None.  Movement: Present. denies leaking of fluid. Review of Systems:   Pertinent items are noted in HPI Denies abnormal vaginal discharge w/ itching/odor/irritation, headaches, visual changes, shortness of breath, chest pain, abdominal pain, severe nausea/vomiting, or problems with urination or bowel movements unless otherwise stated above. Pertinent History Reviewed:  Reviewed past medical,surgical, social, obstetrical and family history.  Reviewed problem list, medications and allergies. Physical Assessment:   Vitals:   10/15/20 1005  BP: 109/68  Pulse: 95  Weight: 169 lb (76.7 kg)  Body mass index is 30.91 kg/m.        Physical Examination:   General appearance: Well appearing, and in no distress  Mental status: Alert, oriented to person, place, and time  Skin: Warm & dry  Cardiovascular: Normal heart rate noted  Respiratory: Normal respiratory effort, no distress  Abdomen: Soft, gravid, nontender  Pelvic: Cervical exam deferred         Extremities: Edema: None  Fetal Status: Fetal Heart Rate (bpm): 142 Fundal Height: 29 cm Movement: Present     Chaperone: n/a    Results for orders placed or performed in visit on 10/15/20 (from the past 24 hour(s))  POC Urinalysis Dipstick OB   Collection Time: 10/15/20 10:03 AM  Result Value Ref Range   Color, UA     Clarity, UA     Glucose, UA Large (3+) (A) Negative   Bilirubin, UA     Ketones, UA neg    Spec Grav, UA     Blood, UA neg    pH, UA     POC,PROTEIN,UA Negative Negative, Trace, Small (1+), Moderate (2+), Large (3+), 4+   Urobilinogen, UA     Nitrite, UA neg    Leukocytes, UA Trace (A) Negative   Appearance     Odor      Assessment & Plan:  1) Low-risk pregnancy [id number] at [redacted]w[redacted]d with an Estimated Date of Delivery: 01/06/21   2) Hx of GHTN on baby ASA 81 mg,    Meds: No orders of the defined types were placed in this encounter.  Labs/procedures today:   Plan:  Continue routine obstetrical care  Next visit: prefers in person    Reviewed: Preterm labor symptoms and general obstetric precautions including but not limited to vaginal bleeding, contractions, leaking of fluid and fetal movement were reviewed in detail with the patient.  All questions were answered. Has home bp cuff. Rx faxed to . Check bp weekly, let us know if >140/90.  Follow-up: Return in about 4 weeks (around 11/12/2020) for LROB.  Orders Placed This Encounter  Procedures  . Tdap vaccine greater than or equal to 7yo IM  . POC Urinalysis Dipstick OB    Lazaro Arms, MD 10/15/2020 10:23 AM

## 2020-10-16 LAB — CBC
Hematocrit: 35.5 % (ref 34.0–46.6)
Hemoglobin: 11.9 g/dL (ref 11.1–15.9)
MCH: 30.8 pg (ref 26.6–33.0)
MCHC: 33.5 g/dL (ref 31.5–35.7)
MCV: 92 fL (ref 79–97)
Platelets: 194 10*3/uL (ref 150–450)
RBC: 3.86 x10E6/uL (ref 3.77–5.28)
RDW: 12.8 % (ref 11.7–15.4)
WBC: 6.7 10*3/uL (ref 3.4–10.8)

## 2020-10-16 LAB — ANTIBODY SCREEN: Antibody Screen: NEGATIVE

## 2020-10-16 LAB — RPR: RPR Ser Ql: NONREACTIVE

## 2020-10-16 LAB — GLUCOSE TOLERANCE, 2 HOURS W/ 1HR
Glucose, 1 hour: 148 mg/dL (ref 65–179)
Glucose, 2 hour: 122 mg/dL (ref 65–152)
Glucose, Fasting: 86 mg/dL (ref 65–91)

## 2020-10-16 LAB — HIV ANTIBODY (ROUTINE TESTING W REFLEX): HIV Screen 4th Generation wRfx: NONREACTIVE

## 2020-11-06 NOTE — L&D Delivery Note (Signed)
OB/GYN Faculty Practice Delivery Note  Michele Bates is a 30 y.o. [phone] s/p SVD at [redacted]w[redacted]d. She was admitted for labor.   ROM: rupture date, rupture time, delivery date, or delivery time have not been documented with clear fluid GBS Status:  Negative/-- (02/09 1635) Maximum Maternal Temperature: 98  Labor Progress: . Initial SVE: 5cm. She then progressed to complete.   Delivery Date/Time: 2/19 at 0307 Delivery: Called to room and patient was complete and pushing. Head delivered LOA. Loose nuchal cord present. Shoulder and body delivered in usual fashion. Infant with spontaneous cry, placed on mother's abdomen, dried and stimulated. Cord clamped x 2 after 1-minute delay, and cut by FOB. Cord blood drawn. Placenta delivered spontaneously with gentle cord traction. Fundus firm with massage and Pitocin. Labia, perineum, vagina, and cervix inspected inspected with no lacerations.  Baby Weight: pending  Placenta: Sent to L&D Complications: None Lacerations: none EBL: 100 mL Analgesia: None    Infant:  APGAR (1 MIN): 8   APGAR (5 MINS): 9   APGAR (10 MINS):     Casper Harrison, MD [facility] Family Medicine Fellow, [facility] for [facility], [facility] 12/25/2020, 3:20 AM

## 2020-11-12 ENCOUNTER — Encounter: Payer: Self-pay | Admitting: Obstetrics & Gynecology

## 2020-11-12 ENCOUNTER — Ambulatory Visit (INDEPENDENT_AMBULATORY_CARE_PROVIDER_SITE_OTHER): Payer: Medicaid Other | Admitting: Obstetrics & Gynecology

## 2020-11-12 ENCOUNTER — Other Ambulatory Visit: Payer: Self-pay

## 2020-11-12 VITALS — BP 108/70 | HR 99 | Wt 170.0 lb

## 2020-11-12 DIAGNOSIS — Z1389 Encounter for screening for other disorder: Secondary | ICD-10-CM

## 2020-11-12 DIAGNOSIS — Z331 Pregnant state, incidental: Secondary | ICD-10-CM

## 2020-11-12 DIAGNOSIS — Z3480 Encounter for supervision of other normal pregnancy, unspecified trimester: Secondary | ICD-10-CM

## 2020-11-12 DIAGNOSIS — Z3A32 32 weeks gestation of pregnancy: Secondary | ICD-10-CM

## 2020-11-12 LAB — POCT URINALYSIS DIPSTICK OB
Blood, UA: NEGATIVE
Glucose, UA: NEGATIVE
Nitrite, UA: NEGATIVE

## 2020-11-12 NOTE — Progress Notes (Signed)
   LOW-RISK PREGNANCY VISIT Patient name: Michele Bates MRN [id number]  Date of birth: [birth year] Chief Complaint:   Routine Prenatal Visit  History of Present Illness:   Michele Bates is a 30 y.o. [id number] female at [redacted]w[redacted]d with an Estimated Date of Delivery: 01/06/21 being seen today for ongoing management of a low-risk pregnancy.  Depression screen [facility] 2/9 10/15/2020 06/25/2020 09/11/2018 08/26/2018  Decreased Interest 0 0 0 0  Down, Depressed, Hopeless 0 0 0 0  PHQ - 2 Score 0 0 0 0  Altered sleeping 0 0 0 -  Tired, decreased energy 0 0 1 -  Change in appetite 0 0 0 -  Feeling bad or failure about yourself  0 0 0 -  Trouble concentrating 0 0 0 -  Moving slowly or fidgety/restless 0 0 0 -  Suicidal thoughts 0 0 0 -  PHQ-9 Score 0 0 1 -    Today she reports no complaints. Contractions: Irritability. Vag. Bleeding: None.  Movement: Present. denies leaking of fluid. Review of Systems:   Pertinent items are noted in HPI Denies abnormal vaginal discharge w/ itching/odor/irritation, headaches, visual changes, shortness of breath, chest pain, abdominal pain, severe nausea/vomiting, or problems with urination or bowel movements unless otherwise stated above. Pertinent History Reviewed:  Reviewed past medical,surgical, social, obstetrical and family history.  Reviewed problem list, medications and allergies. Physical Assessment:   Vitals:   11/12/20 1131  BP: 108/70  Pulse: 99  Weight: 170 lb (77.1 kg)  Body mass index is 31.09 kg/m.        Physical Examination:   General appearance: Well appearing, and in no distress  Mental status: Alert, oriented to person, place, and time  Skin: Warm & dry  Cardiovascular: Normal heart rate noted  Respiratory: Normal respiratory effort, no distress  Abdomen: Soft, gravid, nontender  Pelvic: Cervical exam deferred         Extremities: Edema: Trace  Fetal Status: Fetal Heart Rate (bpm): 155 Fundal Height: 34 cm Movement: Present     Chaperone: n/a    No results found for this or any previous visit (from the past 24 hour(s)).  Assessment & Plan:  1) Low-risk pregnancy [id number] at [redacted]w[redacted]d with an Estimated Date of Delivery: 01/06/21   2) Hx of GHTN,    Meds: No orders of the defined types were placed in this encounter.  Labs/procedures today:   Plan:  Continue routine obstetrical care  Next visit: prefers in person    Reviewed: Preterm labor symptoms and general obstetric precautions including but not limited to vaginal bleeding, contractions, leaking of fluid and fetal movement were reviewed in detail with the patient.  All questions were answered. Has home bp cuff. Rx faxed to . Check bp weekly, let us know if >140/90.   Follow-up: Return in about 3 weeks (around 12/03/2020) for LROB.  Orders Placed This Encounter  Procedures  . POC Urinalysis Dipstick OB    Lazaro Arms, MD 11/12/2020 11:47 AM

## 2020-11-26 ENCOUNTER — Encounter: Payer: Self-pay | Admitting: *Deleted

## 2020-12-03 ENCOUNTER — Ambulatory Visit (INDEPENDENT_AMBULATORY_CARE_PROVIDER_SITE_OTHER): Payer: Medicaid Other | Admitting: Medical

## 2020-12-03 ENCOUNTER — Other Ambulatory Visit: Payer: Self-pay

## 2020-12-03 ENCOUNTER — Encounter: Payer: Self-pay | Admitting: Medical

## 2020-12-03 VITALS — BP 121/77 | HR 95 | Wt 176.0 lb

## 2020-12-03 DIAGNOSIS — Z3480 Encounter for supervision of other normal pregnancy, unspecified trimester: Secondary | ICD-10-CM

## 2020-12-03 DIAGNOSIS — Z3A35 35 weeks gestation of pregnancy: Secondary | ICD-10-CM

## 2020-12-03 DIAGNOSIS — Z8759 Personal history of other complications of pregnancy, childbirth and the puerperium: Secondary | ICD-10-CM

## 2020-12-03 NOTE — Patient Instructions (Signed)
Fetal Movement Counts Patient Name: ________________________________________________ Patient Due Date: ____________________  What is a fetal movement count? A fetal movement count is the number of times that you feel your baby move during a certain amount of time. This may also be called a fetal kick count. A fetal movement count is recommended for every pregnant woman. You may be asked to start counting fetal movements as early as week 28 of your pregnancy. Pay attention to when your baby is most active. You may notice your baby's sleep and wake cycles. You may also notice things that make your baby move more. You should do a fetal movement count:  When your baby is normally most active.  At the same time each day. A good time to count movements is while you are resting, after having something to eat and drink. How do I count fetal movements? 1. Find a quiet, comfortable area. Sit, or lie down on your side. 2. Write down the date, the start time and stop time, and the number of movements that you felt between those two times. Take this information with you to your health care visits. 3. Write down your start time when you feel the first movement. 4. Count kicks, flutters, swishes, rolls, and jabs. You should feel at least 10 movements. 5. You may stop counting after you have felt 10 movements, or if you have been counting for 2 hours. Write down the stop time. 6. If you do not feel 10 movements in 2 hours, contact your health care provider for further instructions. Your health care provider may want to do additional tests to assess your baby's well-being. Contact a health care provider if:  You feel fewer than 10 movements in 2 hours.  Your baby is not moving like he or she usually does. Date: ____________ Start time: ____________ Stop time: ____________ Movements: ____________ Date: ____________ Start time: ____________ Stop time: ____________ Movements: ____________ Date: ____________  Start time: ____________ Stop time: ____________ Movements: ____________ Date: ____________ Start time: ____________ Stop time: ____________ Movements: ____________ Date: ____________ Start time: ____________ Stop time: ____________ Movements: ____________ Date: ____________ Start time: ____________ Stop time: ____________ Movements: ____________ Date: ____________ Start time: ____________ Stop time: ____________ Movements: ____________ Date: ____________ Start time: ____________ Stop time: ____________ Movements: ____________ Date: ____________ Start time: ____________ Stop time: ____________ Movements: ____________ This information is not intended to replace advice given to you by your health care provider. Make sure you discuss any questions you have with your health care provider. Document Revised: 06/12/2019 Document Reviewed: 06/12/2019 Elsevier Patient Education  2021 Elsevier Inc[employer] Rosen's Emergency Medicine: Concepts and Clinical Practice (9th ed., pp. 2296- 2312). Elsevier.">    Braxton Hicks Contractions Contractions of the uterus can occur throughout pregnancy, but they are not always a sign that you are in labor. You may have practice contractions called Braxton Hicks contractions. These false labor contractions are sometimes confused with true labor. What are Braxton Hicks contractions? Braxton Hicks contractions are tightening movements that occur in the muscles of the uterus before labor. Unlike true labor contractions, these contractions do not result in opening (dilation) and thinning of the cervix. Toward the end of pregnancy (32-34 weeks), Braxton Hicks contractions can happen more often and may become stronger. These contractions are sometimes difficult to tell apart from true labor because they can be very uncomfortable. You should not feel embarrassed if you go to the hospital with false labor. Sometimes, the only way to tell if you are in true labor is   for your health care  provider to look for changes in the cervix. The health care provider will do a physical exam and may monitor your contractions. If you are not in true labor, the exam should show that your cervix is not dilating and your water has not broken. If there are no other health problems associated with your pregnancy, it is completely safe for you to be sent home with false labor. You may continue to have Braxton Hicks contractions until you go into true labor. How to tell the difference between true labor and false labor True labor  Contractions last 30-70 seconds.  Contractions become very regular.  Discomfort is usually felt in the top of the uterus, and it spreads to the lower abdomen and low back.  Contractions do not go away with walking.  Contractions usually become more intense and increase in frequency.  The cervix dilates and gets thinner. False labor  Contractions are usually shorter and not as strong as true labor contractions.  Contractions are usually irregular.  Contractions are often felt in the front of the lower abdomen and in the groin.  Contractions may go away when you walk around or change positions while lying down.  Contractions get weaker and are shorter-lasting as time goes on.  The cervix usually does not dilate or become thin. Follow these instructions at home:  Take over-the-counter and prescription medicines only as told by your health care provider.  Keep up with your usual exercises and follow other instructions from your health care provider.  Eat and drink lightly if you think you are going into labor.  If Braxton Hicks contractions are making you uncomfortable: ? Change your position from lying down or resting to walking, or change from walking to resting. ? Sit and rest in a tub of warm water. ? Drink enough fluid to keep your urine pale yellow. Dehydration may cause these contractions. ? Do slow and deep breathing several times an hour.  Keep  all follow-up prenatal visits as told by your health care provider. This is important.   Contact a health care provider if:  You have a fever.  You have continuous pain in your abdomen. Get help right away if:  Your contractions become stronger, more regular, and closer together.  You have fluid leaking or gushing from your vagina.  You pass blood-tinged mucus (bloody show).  You have bleeding from your vagina.  You have low back pain that you never had before.  You feel your baby's head pushing down and causing pelvic pressure.  Your baby is not moving inside you as much as it used to. Summary  Contractions that occur before labor are called Braxton Hicks contractions, false labor, or practice contractions.  Braxton Hicks contractions are usually shorter, weaker, farther apart, and less regular than true labor contractions. True labor contractions usually become progressively stronger and regular, and they become more frequent.  Manage discomfort from Braxton Hicks contractions by changing position, resting in a warm bath, drinking plenty of water, or practicing deep breathing. This information is not intended to replace advice given to you by your health care provider. Make sure you discuss any questions you have with your health care provider. Document Revised: 10/05/2017 Document Reviewed: 03/08/2017 Elsevier Patient Education  2021 Elsevier Inc.  

## 2020-12-03 NOTE — Progress Notes (Signed)
   PRENATAL VISIT NOTE  Subjective:  Michele Bates is a 30 y.o. [phone] at [redacted]w[redacted]d being seen today for ongoing prenatal care.  She is currently monitored for the following issues for this low-risk pregnancy and has Glucosuria; Encounter for supervision of normal pregnancy, antepartum; and History of gestational hypertension on their problem list.  Patient reports mucous discharge, intermittent pelvic pressure.  Contractions: Irritability. Vag. Bleeding: None.  Movement: Present. Denies leaking of fluid.   The following portions of the patient's history were reviewed and updated as appropriate: allergies, current medications, past family history, past medical history, past social history, past surgical history and problem list.   Objective:   Vitals:   12/03/20 1117  BP: 121/77  Pulse: 95  Weight: 176 lb (79.8 kg)    Fetal Status:     Movement: Present     General:  Alert, oriented and cooperative. Patient is in no acute distress.  Skin: Skin is warm and dry. No rash noted.   Cardiovascular: Normal heart rate noted  Respiratory: Normal respiratory effort, no problems with respiration noted  Abdomen: Soft, gravid, appropriate for gestational age.  Pain/Pressure: Present     Pelvic: Cervical exam deferred        Extremities: Normal range of motion.  Edema: Trace  Mental Status: Normal mood and affect. Normal behavior. Normal judgment and thought content.   Assessment and Plan:  Pregnancy: [id number] at [redacted]w[redacted]d 1. Supervision of other normal pregnancy, antepartum - Doing well - Desires IP Paragard IUD - Unsure of plan for circ, will discuss with husband - Peds is [facility] - Anticipatory guidance for GBS, GC/Chlamydia and cervical exam at next visit discussed   2. History of gestational hypertension - Normotensive today, but slight increase from recent values this pregnancy - Will continue to monitor - BASA   3. [redacted] weeks gestation of pregnancy  Preterm labor symptoms  and general obstetric precautions including but not limited to vaginal bleeding, contractions, leaking of fluid and fetal movement were reviewed in detail with the patient. Please refer to After Visit Summary for other counseling recommendations.   No follow-ups on file.  No future appointments.  Vonzella Nipple, PA-C

## 2020-12-13 ENCOUNTER — Encounter: Payer: Medicaid Other | Admitting: Advanced Practice Midwife

## 2020-12-15 ENCOUNTER — Other Ambulatory Visit (HOSPITAL_COMMUNITY)
Admission: RE | Admit: 2020-12-15 | Discharge: 2020-12-15 | Disposition: A | Discharge status: Home / Self Care | Payer: Medicaid Other | Source: Ambulatory Visit | Attending: Advanced Practice Midwife | Admitting: Advanced Practice Midwife

## 2020-12-15 ENCOUNTER — Ambulatory Visit (INDEPENDENT_AMBULATORY_CARE_PROVIDER_SITE_OTHER): Payer: Medicaid Other | Admitting: Obstetrics and Gynecology

## 2020-12-15 ENCOUNTER — Encounter: Payer: Self-pay | Admitting: Obstetrics and Gynecology

## 2020-12-15 ENCOUNTER — Other Ambulatory Visit: Payer: Self-pay

## 2020-12-15 VITALS — BP 121/75 | HR 91 | Wt 177.0 lb

## 2020-12-15 DIAGNOSIS — Z1389 Encounter for screening for other disorder: Secondary | ICD-10-CM | POA: Insufficient documentation

## 2020-12-15 DIAGNOSIS — Z8759 Personal history of other complications of pregnancy, childbirth and the puerperium: Secondary | ICD-10-CM | POA: Insufficient documentation

## 2020-12-15 DIAGNOSIS — O0993 Supervision of high risk pregnancy, unspecified, third trimester: Secondary | ICD-10-CM | POA: Insufficient documentation

## 2020-12-15 DIAGNOSIS — Z3A36 36 weeks gestation of pregnancy: Secondary | ICD-10-CM | POA: Diagnosis not present

## 2020-12-15 DIAGNOSIS — Z3480 Encounter for supervision of other normal pregnancy, unspecified trimester: Secondary | ICD-10-CM

## 2020-12-15 LAB — POCT URINALYSIS DIPSTICK OB
Blood, UA: NEGATIVE
Glucose, UA: NEGATIVE
Ketones, UA: NEGATIVE
Leukocytes, UA: NEGATIVE
Nitrite, UA: NEGATIVE
POC,PROTEIN,UA: NEGATIVE

## 2020-12-15 NOTE — Progress Notes (Signed)
Subjective:  Michele Bates is a 30 y.o. [phone] at [redacted]w[redacted]d being seen today for ongoing prenatal care.  She is currently monitored for the following issues for this low-risk pregnancy and has Glucosuria; Encounter for supervision of normal pregnancy, antepartum; and History of gestational hypertension on their problem list.  Patient reports general discomforts of pregnancy.  Contractions: Irritability. Vag. Bleeding: Scant.  Movement: Present. Denies leaking of fluid.   The following portions of the patient's history were reviewed and updated as appropriate: allergies, current medications, past family history, past medical history, past social history, past surgical history and problem list. Problem list updated.  Objective:   Vitals:   12/15/20 1108  BP: 121/75  Pulse: 91  Weight: 177 lb (80.3 kg)    Fetal Status:     Movement: Present     General:  Alert, oriented and cooperative. Patient is in no acute distress.  Skin: Skin is warm and dry. No rash noted.   Cardiovascular: Normal heart rate noted  Respiratory: Normal respiratory effort, no problems with respiration noted  Abdomen: Soft, gravid, appropriate for gestational age. Pain/Pressure: Present     Pelvic:  Cervical exam performed        Extremities: Normal range of motion.  Edema: Trace  Mental Status: Normal mood and affect. Normal behavior. Normal judgment and thought content.   Urinalysis:      Assessment and Plan:  Pregnancy: [id number] at [redacted]w[redacted]d  1. [redacted] weeks gestation of pregnancy Labor precautions - Culture, beta strep (group b only) - Cervicovaginal ancillary only( [place])  2. Screening for genitourinary condition  - POC Urinalysis Dipstick OB  3. High-risk pregnancy in third trimester  - POC Urinalysis Dipstick OB  4. Supervision of other normal pregnancy, antepartum   5. History of gestational hypertension BP stable  Preterm labor symptoms and general obstetric precautions including but not  limited to vaginal bleeding, contractions, leaking of fluid and fetal movement were reviewed in detail with the patient. Please refer to After Visit Summary for other counseling recommendations.  Return in about 1 week (around 12/22/2020) for virtual.   Hermina Staggers, MD

## 2020-12-17 LAB — CERVICOVAGINAL ANCILLARY ONLY
Chlamydia: NEGATIVE
Comment: NEGATIVE
Comment: NORMAL
Neisseria Gonorrhea: NEGATIVE

## 2020-12-20 LAB — CULTURE, BETA STREP (GROUP B ONLY): Strep Gp B Culture: NEGATIVE

## 2020-12-24 ENCOUNTER — Encounter: Payer: Medicaid Other | Admitting: Women's Health

## 2020-12-24 ENCOUNTER — Other Ambulatory Visit: Payer: Self-pay

## 2020-12-25 ENCOUNTER — Encounter (HOSPITAL_COMMUNITY): Payer: Self-pay | Admitting: Obstetrics & Gynecology

## 2020-12-25 ENCOUNTER — Inpatient Hospital Stay (HOSPITAL_COMMUNITY): Payer: Medicaid Other | Admitting: Anesthesiology

## 2020-12-25 ENCOUNTER — Inpatient Hospital Stay (HOSPITAL_COMMUNITY)
Admission: AD | Admit: 2020-12-25 | Discharge: 2020-12-26 | DRG: 807 | Disposition: A | Discharge status: Home / Self Care | Payer: Medicaid Other | Attending: Obstetrics & Gynecology | Admitting: Obstetrics & Gynecology

## 2020-12-25 ENCOUNTER — Other Ambulatory Visit: Payer: Self-pay

## 2020-12-25 DIAGNOSIS — O26893 Other specified pregnancy related conditions, third trimester: Secondary | ICD-10-CM | POA: Diagnosis not present

## 2020-12-25 DIAGNOSIS — Z20822 Contact with and (suspected) exposure to covid-19: Secondary | ICD-10-CM | POA: Diagnosis present

## 2020-12-25 DIAGNOSIS — O164 Unspecified maternal hypertension, complicating childbirth: Secondary | ICD-10-CM | POA: Diagnosis not present

## 2020-12-25 DIAGNOSIS — Z3A38 38 weeks gestation of pregnancy: Secondary | ICD-10-CM | POA: Diagnosis not present

## 2020-12-25 LAB — TYPE AND SCREEN
ABO/RH(D): O POS
Antibody Screen: NEGATIVE

## 2020-12-25 LAB — CBC
HCT: 37.4 % (ref 36.0–46.0)
Hemoglobin: 12.7 g/dL (ref 12.0–15.0)
MCH: 30.6 pg (ref 26.0–34.0)
MCHC: 34 g/dL (ref 30.0–36.0)
MCV: 90.1 fL (ref 80.0–100.0)
Platelets: 194 10*3/uL (ref 150–400)
RBC: 4.15 MIL/uL (ref 3.87–5.11)
RDW: 13.3 % (ref 11.5–15.5)
WBC: 7.7 10*3/uL (ref 4.0–10.5)
nRBC: 0 % (ref 0.0–0.2)

## 2020-12-25 LAB — RPR: RPR Ser Ql: NONREACTIVE

## 2020-12-25 LAB — RESP PANEL BY RT-PCR (FLU A&B, COVID) ARPGX2
Influenza A by PCR: NEGATIVE
Influenza B by PCR: NEGATIVE
SARS Coronavirus 2 by RT PCR: NEGATIVE

## 2020-12-25 MED ORDER — ONDANSETRON HCL 4 MG PO TABS: 4.0000 mg | ORAL_TABLET | ORAL | Status: DC | PRN

## 2020-12-25 MED ORDER — WITCH HAZEL-GLYCERIN EX PADS: 1.0000 "application " | MEDICATED_PAD | CUTANEOUS | Status: DC | PRN

## 2020-12-25 MED ORDER — DIPHENHYDRAMINE HCL 25 MG PO CAPS
25.0000 mg | ORAL_CAPSULE | Freq: Four times a day (QID) | ORAL | Status: DC | PRN
Start: 2020-12-25 — End: 2020-12-26

## 2020-12-25 MED ORDER — SOD CITRATE-CITRIC ACID 500-334 MG/5ML PO SOLN: 30.0000 mL | ORAL | Status: DC | PRN

## 2020-12-25 MED ORDER — LACTATED RINGERS IV SOLN: INTRAVENOUS | Status: DC

## 2020-12-25 MED ORDER — OXYTOCIN-SODIUM CHLORIDE 30-0.9 UT/500ML-% IV SOLN
2.5000 [IU]/h | INTRAVENOUS | Status: DC
  Filled 2020-12-25: qty 500

## 2020-12-25 MED ORDER — LACTATED RINGERS IV SOLN: 500.0000 mL | Freq: Once | INTRAVENOUS | Status: DC

## 2020-12-25 MED ORDER — LACTATED RINGERS IV SOLN: 500.0000 mL | INTRAVENOUS | Status: DC | PRN

## 2020-12-25 MED ORDER — ONDANSETRON HCL 4 MG/2ML IJ SOLN: 4.0000 mg | Freq: Four times a day (QID) | INTRAMUSCULAR | Status: DC | PRN

## 2020-12-25 MED ORDER — IBUPROFEN 600 MG PO TABS
600.0000 mg | ORAL_TABLET | Freq: Four times a day (QID) | ORAL | Status: DC
  Administered 2020-12-25 – 2020-12-26 (×6): 600 mg via ORAL
  Filled 2020-12-25 (×6): qty 1

## 2020-12-25 MED ORDER — COCONUT OIL OIL
1.0000 "application " | TOPICAL_OIL | Status: DC | PRN
  Administered 2020-12-25: 1 via TOPICAL

## 2020-12-25 MED ORDER — FENTANYL-BUPIVACAINE-NACL 0.5-0.125-0.9 MG/250ML-% EP SOLN
12.0000 mL/h | EPIDURAL | Status: DC | PRN
  Filled 2020-12-25: qty 250

## 2020-12-25 MED ORDER — ONDANSETRON HCL 4 MG/2ML IJ SOLN: 4.0000 mg | INTRAMUSCULAR | Status: DC | PRN

## 2020-12-25 MED ORDER — FENTANYL CITRATE (PF) 100 MCG/2ML IJ SOLN: 50.0000 ug | INTRAMUSCULAR | Status: DC | PRN

## 2020-12-25 MED ORDER — SENNOSIDES-DOCUSATE SODIUM 8.6-50 MG PO TABS
2.0000 | ORAL_TABLET | ORAL | Status: DC
  Administered 2020-12-25 – 2020-12-26 (×2): 2 via ORAL
  Filled 2020-12-25 (×2): qty 2

## 2020-12-25 MED ORDER — EPHEDRINE 5 MG/ML INJ: 10.0000 mg | INTRAVENOUS | Status: DC | PRN

## 2020-12-25 MED ORDER — TETANUS-DIPHTH-ACELL PERTUSSIS 5-2.5-18.5 LF-MCG/0.5 IM SUSY: 0.5000 mL | PREFILLED_SYRINGE | Freq: Once | INTRAMUSCULAR | Status: DC

## 2020-12-25 MED ORDER — LIDOCAINE HCL (PF) 1 % IJ SOLN: 30.0000 mL | INTRAMUSCULAR | Status: DC | PRN

## 2020-12-25 MED ORDER — MEASLES, MUMPS & RUBELLA VAC IJ SOLR: 0.5000 mL | Freq: Once | INTRAMUSCULAR | Status: DC

## 2020-12-25 MED ORDER — PHENYLEPHRINE 40 MCG/ML (10ML) SYRINGE FOR IV PUSH (FOR BLOOD PRESSURE SUPPORT): 80.0000 ug | PREFILLED_SYRINGE | INTRAVENOUS | Status: DC | PRN

## 2020-12-25 MED ORDER — BENZOCAINE-MENTHOL 20-0.5 % EX AERO: 1.0000 "application " | INHALATION_SPRAY | CUTANEOUS | Status: DC | PRN

## 2020-12-25 MED ORDER — DIPHENHYDRAMINE HCL 50 MG/ML IJ SOLN: 12.5000 mg | INTRAMUSCULAR | Status: DC | PRN

## 2020-12-25 MED ORDER — SIMETHICONE 80 MG PO CHEW: 80.0000 mg | CHEWABLE_TABLET | ORAL | Status: DC | PRN

## 2020-12-25 MED ORDER — ACETAMINOPHEN 325 MG PO TABS: 650.0000 mg | ORAL_TABLET | ORAL | Status: DC | PRN

## 2020-12-25 MED ORDER — PARAGARD INTRAUTERINE COPPER IU IUD
INTRAUTERINE_SYSTEM | Freq: Once | INTRAUTERINE | Status: DC
  Filled 2020-12-25: qty 1

## 2020-12-25 MED ORDER — DOCUSATE SODIUM 100 MG PO CAPS
100.0000 mg | ORAL_CAPSULE | Freq: Two times a day (BID) | ORAL | Status: DC
  Administered 2020-12-25 – 2020-12-26 (×2): 100 mg via ORAL
  Filled 2020-12-25 (×2): qty 1

## 2020-12-25 MED ORDER — OXYTOCIN BOLUS FROM INFUSION
333.0000 mL | Freq: Once | INTRAVENOUS | Status: AC
  Administered 2020-12-25: 333 mL via INTRAVENOUS

## 2020-12-25 MED ORDER — PRENATAL MULTIVITAMIN CH
1.0000 | ORAL_TABLET | Freq: Every day | ORAL | Status: DC
  Administered 2020-12-25 – 2020-12-26 (×2): 1 via ORAL
  Filled 2020-12-25 (×2): qty 1

## 2020-12-25 MED ORDER — DIBUCAINE (PERIANAL) 1 % EX OINT: 1.0000 "application " | TOPICAL_OINTMENT | CUTANEOUS | Status: DC | PRN

## 2020-12-25 NOTE — MAU Note (Signed)
Pt to wheelchair for transfer to LD. Pt requesting an epidural - LR infusing at bolus rate. Labs sent -covid collected and sent.

## 2020-12-25 NOTE — Anesthesia Preprocedure Evaluation (Signed)
Anesthesia Evaluation  Patient identified by MRN, date of birth, ID band Patient awake    Reviewed: Allergy & Precautions, H&P , Patient's Chart, lab work & pertinent test results  Airway Mallampati: II       Dental no notable dental hx.    Pulmonary neg pulmonary ROS,    Pulmonary exam normal        Cardiovascular hypertension, Normal cardiovascular exam     Neuro/Psych negative neurological ROS  negative psych ROS   GI/Hepatic negative GI ROS, Neg liver ROS,   Endo/Other  negative endocrine ROS  Renal/GU negative Renal ROS  negative genitourinary   Musculoskeletal negative musculoskeletal ROS (+)   Abdominal (+) + obese,   Peds  Hematology negative hematology ROS (+)   Anesthesia Other Findings   Reproductive/Obstetrics (+) Pregnancy                             Anesthesia Physical  Anesthesia Plan  ASA: II  Anesthesia Plan: Epidural   Post-op Pain Management:    Induction:   PONV Risk Score and Plan:   Airway Management Planned:   Additional Equipment:   Intra-op Plan:   Post-operative Plan:   Informed Consent: I have reviewed the patients History and Physical, chart, labs and discussed the procedure including the risks, benefits and alternatives for the proposed anesthesia with the patient or authorized representative who has indicated his/her understanding and acceptance.       Plan Discussed with:   Anesthesia Plan Comments:         Anesthesia Quick Evaluation

## 2020-12-25 NOTE — Anesthesia Postprocedure Evaluation (Signed)
Anesthesia Post Note  Patient: Michele Bates  Procedure(s) Performed: AN AD HOC LABOR EPIDURAL     Patient location during evaluation: L&D Level of consciousness: awake Pain management: pain level not controlled Respiratory status: spontaneous breathing Anesthetic complications: no Comments: Patient was completely dilated. No procedure was performed   No complications documented.  Last Vitals:  Vitals:   12/25/20 0137 12/25/20 0200  BP: 125/83 123/83  Pulse: 96 76  Resp: 19 18  Temp: 36.7 C     Last Pain:  Vitals:   12/25/20 0136  PainSc: 8    Pain Goal:                   Caren Macadam

## 2020-12-25 NOTE — Discharge Summary (Addendum)
Postpartum Discharge Summary    Patient Name: Michele Bates DOB: [birth year] MRN: [id number]  Date of admission: 12/25/2020 Delivery date:12/25/2020  Delivering provider: Janet Berlin  Date of discharge: 12/26/2020  Admitting diagnosis: Normal labor [O80, Z37.9] Intrauterine pregnancy: [redacted]w[redacted]d    Secondary diagnosis:  Active Problems:   Normal labor  Additional problems: h/o gHTN in prior pregnancy    Discharge diagnosis: Term Pregnancy Delivered                                              Post partum procedures:none Augmentation: N/A Complications: None  Hospital course: Onset of Labor With Vaginal Delivery      30y.o. yo [id number] 315w2das admitted in Active Labor on 12/25/2020. Patient had an uncomplicated labor course as follows:  Membrane Rupture Time/Date: 2:53 AM ,12/25/2020   Delivery Method:Vaginal, Spontaneous  Episiotomy: None  Lacerations:  None  Patient had an uncomplicated postpartum course.  She is ambulating, tolerating a regular diet, passing flatus, and urinating well. Her BPs remained normotensive during her hospital stay. Patient is discharged home in stable condition on 12/26/20 per her request for early d/c as long as the baby can go as well.  Newborn Data: Birth date:12/25/2020  Birth time:3:07 AM  Gender:Female  Living status:Living  Apgars:8 ,9  Weight:3606 g (7lb 15.2oz)  Magnesium Sulfate received: No BMZ received: No Rhophylac:N/A MMR:N/A T-DaP:Given prenatally Flu: Yes Transfusion:No  Physical exam  Vitals:   12/25/20 1505 12/25/20 1843 12/25/20 2149 12/26/20 0511  BP: 116/70 110/83 108/71 96/69  Pulse: 74 90 (!) 105 74  Resp: '18 18 18 16  ' Temp: 98.4 F (36.9 C) 98.7 F (37.1 C) 98.3 F (36.8 C) 98.1 F (36.7 C)  TempSrc:  Oral Oral Oral  SpO2:   99% 100%  Weight:       General: alert, cooperative and no distress Lochia: appropriate Uterine Fundus: firm Incision: N/A DVT Evaluation: Negative Homan's sign. No cords or  calf tenderness. No significant calf/ankle edema. Labs: Lab Results  Component Value Date   WBC 7.7 12/25/2020   HGB 12.7 12/25/2020   HCT 37.4 12/25/2020   MCV 90.1 12/25/2020   PLT 194 12/25/2020   CMP Latest Ref Rng & Units 06/25/2020  Glucose 65 - 99 mg/dL 86  BUN 6 - 20 mg/dL 7  Creatinine 0.57 - 1.00 mg/dL 0.55(L)  Sodium 134 - 144 mmol/L 136  Potassium 3.5 - 5.2 mmol/L 4.4  Chloride 96 - 106 mmol/L 102  CO2 20 - 29 mmol/L 21  Calcium 8.7 - 10.2 mg/dL 9.3  Total Protein 6.0 - 8.5 g/dL 6.6  Total Bilirubin 0.0 - 1.2 mg/dL 0.3  Alkaline Phos 48 - 121 IU/L 56  AST 0 - 40 IU/L 11  ALT 0 - 32 IU/L 8   Edinburgh Score: Edinburgh Postnatal Depression Scale Screening Tool 12/25/2020  I have been able to laugh and see the funny side of things. 0  I have looked forward with enjoyment to things. 0  I have blamed myself unnecessarily when things went wrong. 0  I have been anxious or worried for no good reason. 0  I have felt scared or panicky for no good reason. 0  Things have been getting on top of me. 0  I have been so unhappy that I have had difficulty sleeping. 0  I  have felt sad or miserable. 0  I have been so unhappy that I have been crying. 0  The thought of harming myself has occurred to me. 0  Edinburgh Postnatal Depression Scale Total 0     After visit meds:  Allergies as of 12/26/2020   No Known Allergies     Medication List    STOP taking these medications   aspirin 81 MG chewable tablet   prenatal multivitamin Tabs tablet     TAKE these medications   ibuprofen 600 MG tablet Commonly known as: ADVIL Take 1 tablet (600 mg total) by mouth every 6 (six) hours.        Discharge home in stable condition Infant Feeding: Breast Infant Disposition:home with mother Discharge instruction: per After Visit Summary and Postpartum booklet. Activity: Advance as tolerated. Pelvic rest for 6 weeks.  Diet: routine diet  Future Appointments: Future Appointments   Date Time Provider [place]  12/28/2020 11:50 AM Caren Macadam, MD [facility]   Follow up Visit:  Please schedule this patient for a In person postpartum visit in 6 weeks with the following provider: Any provider. Additional Postpartum F/U:none  Low risk pregnancy complicated by: uncomplicated Delivery mode:  Vaginal, Spontaneous  Anticipated Birth Control: plans for outpt IUD   12/26/2020 Alcus Dad, MD   CNM attestation I have seen and examined this patient and agree with above documentation in the resident's note.   Michele Bates is a 30 y.o. [phone] s/p SVD.   Pain is well controlled.  Plan for birth control is IUD.  Method of Feeding: breast  PE:  BP 96/69 (BP Location: Left Arm)    Pulse 74    Temp 98.1 F (36.7 C) (Oral)    Resp 16    Wt 81.7 kg    LMP 03/11/2020 (Approximate)    SpO2 100%    Breastfeeding Unknown    BMI 32.96 kg/m  Fundus firm  Recent Labs    12/25/20 0213  HGB 12.7  HCT 37.4     Plan: discharge today - postpartum care discussed - f/u clinic in 4 weeks for postpartum visit   Myrtis Ser, CNM 8:45 AM  12/26/2020

## 2020-12-25 NOTE — Lactation Note (Signed)
This note was copied from a baby's chart. Lactation Consultation Note  Patient Name: Michele Bates Today's Date: 12/25/2020   Age:29 hours  (LC services- no charge) LC attempted to see mom but L&D (RN) informed LC that  mom is in a lot of pain and best if  [facility] services see mom in the morning,  this is her 3rd child.  Maternal Data    Feeding    LATCH Score                    Lactation Tools Discussed/Used    Interventions    Discharge    Consult Status      [name] 12/25/2020, 3:51 AM

## 2020-12-25 NOTE — H&P (Signed)
OBSTETRIC ADMISSION HISTORY AND PHYSICAL  Michele Bates is a 30 y.o. female [phone] with IUP at [redacted]w[redacted]d by 7wk [place] presenting for labor. She reports +FMs, No LOF, no VB, no blurry vision, headaches or peripheral edema, and RUQ pain.  She plans on breast feeding. She requests inpatient paragard for birth control. She received her prenatal care at [facility]   Dating: By 7 wk [place] --->  Estimated Date of Delivery: 01/06/21  Sono:    @[redacted]w[redacted]d , CWD, normal anatomy, cephalic presentation, anterior placenta, 326g, 89% EFW   Prenatal History/Complications:  --hx ghtn in prior pregnancy    Past Medical History: Past Medical History:  Diagnosis Date  . Chronic pelvic pain in female     Past Surgical History: Past Surgical History:  Procedure Laterality Date  . TUMOR REMOVAL     head    Obstetrical History: OB History    Gravida  4   Para  2   Term  2   Preterm      AB  1   Living  2     SAB  1   IAB      Ectopic      Multiple  0   Live Births  2           Social History Social History   Socioeconomic History  . Marital status: Married    Spouse name:  . Number of children: 2  . Years of education: 37  . Highest education level: Associate degree: academic program  Occupational History  . Not on file  Tobacco Use  . Smoking status: Never Smoker  . Smokeless tobacco: Never Used  Vaping Use  . Vaping Use: Never used  Substance and Sexual Activity  . Alcohol use: No  . Drug use: No  . Sexual activity: Yes    Birth control/protection: None  Other Topics Concern  . Not on file  Social History Narrative  . Not on file   Social Determinants of Health   Financial Resource Strain: Low Risk   . Difficulty of Paying Living Expenses: Not hard at all  Food Insecurity: No Food Insecurity  . Worried About 18 in the Last Year: Never true  . Ran Out of Food in the Last Year: Never true  Transportation Needs: No Transportation  Needs  . Lack of Transportation (Medical): No  . Lack of Transportation (Non-Medical): No  Physical Activity: Insufficiently Active  . Days of Exercise per Week: 3 days  . Minutes of Exercise per Session: 10 min  Stress: No Stress Concern Present  . Feeling of Stress : Not at all  Social Connections: Moderately Integrated  . Frequency of Communication with Friends and Family: More than three times a week  . Frequency of Social Gatherings with Friends and Family: More than three times a week  . Attends Religious Services: More than 4 times per year  . Active Member of Clubs or Organizations: No  . Attends Programme researcher, broadcasting/film/video Meetings: Never  . Marital Status: Married    Family History: Family History  Problem Relation Age of Onset  . Heart disease Paternal Grandfather   . Diabetes Maternal Grandmother   . Kidney disease Maternal Grandmother   . Heart disease Maternal Grandfather   . Diabetes Maternal Grandfather   . Diabetes Father     Allergies: No Known Allergies  Medications Prior to Admission  Medication Sig Dispense Refill Last Dose  . aspirin 81  MG chewable tablet Chew by mouth.   12/25/2020 at Unknown time  . Prenatal Vit-Fe Fumarate-FA (PRENATAL MULTIVITAMIN) TABS tablet Take 1 tablet by mouth daily at 12 noon.   12/25/2020 at Unknown time     Review of Systems   All systems reviewed and negative except as stated in HPI  Blood pressure 123/83, pulse 76, temperature 98.1 F (36.7 C), resp. rate 19, weight 81.7 kg, last menstrual period 03/11/2020, not currently breastfeeding. General appearance: alert, cooperative and appears stated age Lungs: clear to auscultation bilaterally Heart: regular rate and rhythm Abdomen: soft, non-tender; bowel sounds normal  Extremities: Homans sign is negative, no sign of DVT  Presentation: cephalic Fetal monitoring baseline 120, mod variability, pos accels, no decels  Uterine activity q2 min Dilation: 5.5 Effacement (%):  70 Station: -3 Exam by:: Ginnie Smart RN   Prenatal labs: ABO, Rh: O/Positive/-- (08/20 1100) Antibody: Negative (12/10 0853) Rubella: 2.42 (08/20 1100) RPR: Non Reactive (12/10 0853)  HBsAg: Negative (08/20 1100)  HIV: Non Reactive (12/10 0853)  GBS: Negative/-- (02/09 1635)  2 hr Glucola normal Genetic screening  Low risk, female Anatomy US normal  Prenatal Transfer Tool  Maternal Diabetes: No Genetic Screening: Normal Maternal Ultrasounds/Referrals: Normal Fetal Ultrasounds or other Referrals:  None Maternal Substance Abuse:  No Significant Maternal Medications:  None Significant Maternal Lab Results: Group B Strep negative  No results found for this or any previous visit (from the past 24 hour(s)).  Patient Active Problem List   Diagnosis Date Noted  . Encounter for supervision of normal pregnancy, antepartum 06/25/2020  . History of gestational hypertension 06/25/2020  . Glucosuria 11/13/2018    Assessment/Plan:  Michele Bates is a 30 y.o. [id number] at [redacted]w[redacted]d here for labor  #Labor: expectant mgmt, anticipate SVD  #Pain: Desires epidural #FWB:  cat I  #ID:  gbs neg  #MOF: breast #MOC: inpt paragard, consented at bedside  #Circ:  undecided  Gita Kudo, MD  12/25/2020, 2:05 AM

## 2020-12-25 NOTE — MAU Note (Signed)
Patient reports CTX that got intense 2 hours ago, now 5 mins apart.  Denies LOF.  Some bloody show.  + FM.  No complications w/ pregnancy.  Closed on last exam.

## 2020-12-26 LAB — BIRTH TISSUE RECOVERY COLLECTION (PLACENTA DONATION)

## 2020-12-26 MED ORDER — IBUPROFEN 600 MG PO TABS: 600.0000 mg | ORAL_TABLET | Freq: Four times a day (QID) | ORAL | 0 refills | Status: DC

## 2020-12-26 NOTE — Discharge Instructions (Signed)

## 2020-12-27 NOTE — Progress Notes (Signed)
Pt scheduled for mychart visit, did not have bp cuff. Rescheduled.  This encounter was created in error - please disregard.

## 2020-12-28 ENCOUNTER — Telehealth: Payer: Medicaid Other | Admitting: Family Medicine

## 2021-01-31 ENCOUNTER — Ambulatory Visit: Payer: Medicaid Other | Admitting: Women's Health

## 2021-02-02 ENCOUNTER — Other Ambulatory Visit: Payer: Self-pay

## 2021-02-02 ENCOUNTER — Ambulatory Visit (INDEPENDENT_AMBULATORY_CARE_PROVIDER_SITE_OTHER): Payer: Medicaid Other | Admitting: Women's Health

## 2021-02-02 ENCOUNTER — Encounter: Payer: Self-pay | Admitting: Women's Health

## 2021-02-02 DIAGNOSIS — Z30011 Encounter for initial prescription of contraceptive pills: Secondary | ICD-10-CM | POA: Diagnosis not present

## 2021-02-02 MED ORDER — NORETHINDRONE 0.35 MG PO TABS: 1.0000 | ORAL_TABLET | Freq: Every day | ORAL | 11 refills | Status: DC

## 2021-02-02 NOTE — Progress Notes (Signed)
POSTPARTUM VISIT Patient name: Michele Bates MRN [id number]  Date of birth: 12-10-1990 Chief Complaint:   Postpartum Care  History of Present Illness:   Michele Bates is a 30 y.o. [phone] Hispanic female being seen today for a postpartum visit. She is 5 weeks postpartum following a spontaneous vaginal delivery at 38.2 gestational weeks. IOL: No, for n/a. Anesthesia: none.  Laceration: none.  Complications: none. Inpatient contraception: no.   Pregnancy uncomplicated. Tobacco use: no. Substance use disorder: no. Last pap smear: 06/25/20 and results were NILM w/ HRHPV negative. Next pap smear due: 2024 No LMP recorded.  Postpartum course has been uncomplicated. Bleeding no bleeding. Bowel function is normal. Bladder function is normal. Urinary incontinence? No, fecal incontinence? No Patient is sexually active. Last sexual activity: yesterday.  Desired contraception: POPs. Patient does want a pregnancy in the future.  Desired family size is 4 children.   Upstream - 02/02/21 0847      Pregnancy Intention Screening   Does the patient want to become pregnant in the next year? Yes    Does the patient's partner want to become pregnant in the next year? Yes    Would the patient like to discuss contraceptive options today? Yes      Contraception Wrap Up   Current Method Female Condom    End Method Oral Contraceptive    Contraception Counseling Provided Yes          The pregnancy intention screening data noted above was reviewed. Potential methods of contraception were discussed. The patient elected to proceed with Oral Contraceptive.   Edinburgh Postpartum Depression Screening: Negative  Edinburgh Postnatal Depression Scale - 02/02/21 0846      Edinburgh Postnatal Depression Scale:  In the Past 7 Days   I have been able to laugh and see the funny side of things. 0    I have looked forward with enjoyment to things. 0    I have blamed myself unnecessarily when things went wrong. 0     I have been anxious or worried for no good reason. 0    I have felt scared or panicky for no good reason. 0    Things have been getting on top of me. 0    I have been so unhappy that I have had difficulty sleeping. 0    I have felt sad or miserable. 0    I have been so unhappy that I have been crying. 0    The thought of harming myself has occurred to me. 0    Edinburgh Postnatal Depression Scale Total 0          Baby's course has been uncomplicated. Baby is feeding by breast: milk supply adequate. Infant has a pediatrician/family doctor? Yes.  Childcare strategy if returning to work/school: n/a.  Pt has material needs met for her and baby: Yes.   Review of Systems:   Pertinent items are noted in HPI Denies Abnormal vaginal discharge w/ itching/odor/irritation, headaches, visual changes, shortness of breath, chest pain, abdominal pain, severe nausea/vomiting, or problems with urination or bowel movements. Pertinent History Reviewed:  Reviewed past medical,surgical, obstetrical and family history.  Reviewed problem list, medications and allergies. OB History  Gravida Para Term Preterm AB Living  '4 3 3   1 3  ' SAB IAB Ectopic Multiple Live Births  1     0 3    # Outcome Date GA Lbr Len/2nd Weight Sex Delivery Anes PTL Lv  4 Term 12/25/20 16w2d00:02 /  00:12 7 lb 15.2 oz (3.606 kg) M Vag-Spont None  LIV  3 Term 03/23/19 22w0d02:45 / 00:21 7 lb 4.1 oz (3.29 kg) F Vag-Spont EPI  LIV     Complications: Gestational hypertension  2 SAB 10/22/17 152w0d       1 Term 08/18/11 4038w2d lb 5 oz (3.317 kg) F Vag-Spont EPI N LIV   Physical Assessment:   Vitals:   02/02/21 0845  BP: 116/71  Pulse: 71  Weight: 158 lb 9.6 oz (71.9 kg)  Height: '5\' 2"'  (1.575 m)  Body mass index is 29.01 kg/m.       Physical Examination:   General appearance: alert, well appearing, and in no distress  Mental status: alert, oriented to person, place, and time  Skin: warm & dry   Cardiovascular: normal  heart rate noted   Respiratory: normal respiratory effort, no distress   Breasts: deferred, no complaints   Abdomen: soft, non-tender   Pelvic: examination not indicated  Rectal: not examined   Extremities: no edema  Chaperone: N/A         No results found for this or any previous visit (from the past 24 hour(s)).  Assessment & Plan:  1) Postpartum exam 2) 5 wks s/p spontaneous vaginal delivery 3) breast feeding 4) Depression screening 5) Contraception management Rx micronor w/ 11RF, understands has to take at exact same time daily to be effective, if late taking use condom as back-up   Essential components of care per ACOG recommendations:  1.  Mood and well being:  . If positive depression screen, discussed and plan developed.  . If using tobacco we discussed reduction/cessation and risk of relapse . If current substance abuse, we discussed and referral to local resources was offered.   2. Infant care and feeding:  . If breastfeeding, discussed returning to work, pumping, breastfeeding-associated pain, guidance regarding return to fertility while lactating if not using another method. If needed, patient was provided with a letter to be allowed to pump q 2-3hrs to support lactation in a private location with access to a refrigerator to store breastmilk.   . Recommended that all caregivers be immunized for flu, pertussis and other preventable communicable diseases . If pt does not have material needs met for her/baby, referred to local resources for help obtaining these.  3. Sexuality, contraception and birth spacing . Provided guidance regarding sexuality, management of dyspareunia, and resumption of intercourse . Discussed avoiding interpregnancy interval <[id number] recommended birth spacing of 18 months  4. Sleep and fatigue . Discussed coping options for fatigue and sleep disruption . Encouraged family/partner/community support of 4 hrs of uninterrupted sleep to help with mood  and fatigue  5. Physical recovery  . If pt had a C/S, assessed incisional pain and providing guidance on normal vs prolonged recovery . If pt had a laceration, perineal healing and pain reviewed.  . If urinary or fecal incontinence, discussed management and referred to PT or uro/gyn if indicated  . Patient is safe to resume physical activity. Discussed attainment of healthy weight.  6.  Chronic disease management . Discussed pregnancy complications if any, and their implications for future childbearing and long-term maternal health. . Review recommendations for prevention of recurrent pregnancy complications, such as 17 hydroxyprogesterone caproate to reduce risk for recurrent PTB not applicable, or aspirin to reduce risk of preeclampsia not applicable. . Pt had GDM: No. If yes, 2hr GTT scheduled: not applicable. . Reviewed medications and non-pregnant dosing including  consideration of whether pt is breastfeeding using a reliable resource such as LactMed: not applicable . Referred for f/u w/ PCP or subspecialist providers as indicated: not applicable  7. Health maintenance . Mammogram at 30yo or earlier if indicated . Pap smears as indicated  Meds:  Meds ordered this encounter  Medications  . norethindrone (MICRONOR) 0.35 MG tablet    Sig: Take 1 tablet (0.35 mg total) by mouth daily.    Dispense:  28 tablet    Refill:  11    Order Specific Question:   Supervising Provider    Answer:   Florian Buff [[id number]]    Follow-up: Return in about 1 year (around 02/02/2022) for Physical.   No orders of the defined types were placed in this encounter.   [place], [facility] 02/02/2021 9:16 AM

## 2022-03-17 ENCOUNTER — Telehealth: Payer: Self-pay | Admitting: Women's Health

## 2022-03-17 MED ORDER — NORETHINDRONE 0.35 MG PO TABS: 1.0000 | ORAL_TABLET | Freq: Every day | ORAL | 11 refills | Status: DC

## 2022-03-17 NOTE — Addendum Note (Signed)
Addended by: Cyril Mourning A on: 03/17/2022 01:47 PM ? ? Modules accepted: Orders ? ?

## 2022-03-17 NOTE — Telephone Encounter (Signed)
Refilled micronor 

## 2022-03-17 NOTE — Telephone Encounter (Signed)
Patient called stating that she needs a refill of her birth control medication norethindrone sent to her pharmacy in [facility] on [address] in [place]. Please contact pt ?

## 2022-05-01 ENCOUNTER — Ambulatory Visit (INDEPENDENT_AMBULATORY_CARE_PROVIDER_SITE_OTHER): Payer: Medicaid Other | Admitting: *Deleted

## 2022-05-01 VITALS — BP 116/73 | HR 89 | Wt 157.0 lb

## 2022-05-01 DIAGNOSIS — Z3201 Encounter for pregnancy test, result positive: Secondary | ICD-10-CM | POA: Diagnosis not present

## 2022-05-01 DIAGNOSIS — N926 Irregular menstruation, unspecified: Secondary | ICD-10-CM

## 2022-05-01 LAB — POCT URINE PREGNANCY: Preg Test, Ur: POSITIVE — AB

## 2022-05-01 NOTE — Progress Notes (Signed)
   NURSE VISIT- PREGNANCY CONFIRMATION   SUBJECTIVE:  Michele Bates is a 31 y.o. [phone] female at Unknown by uncertain LMP of No LMP recorded (lmp unknown). Patient is pregnant. Here for pregnancy confirmation.  Home pregnancy test: positive x 1   She reports no complaints.  She is not taking prenatal vitamins.    OBJECTIVE:  BP 116/73 (BP Location: Right Arm, Patient Position: Sitting, Cuff Size: Normal)   Pulse 89   Wt 157 lb (71.2 kg)   LMP  (LMP Unknown)   Breastfeeding No   BMI 28.72 kg/m   Appears well, in no apparent distress  Results for orders placed or performed in visit on 05/01/22 (from the past 24 hour(s))  POCT urine pregnancy   Collection Time: 05/01/22 11:00 AM  Result Value Ref Range   Preg Test, Ur Positive (A) Negative    ASSESSMENT: Positive pregnancy test, Unknown by LMP    PLAN: Schedule for dating ultrasound in TBD. HCG ordered  Prenatal vitamins: plans to begin OTC ASAP   Nausea medicines: not currently needed   OB packet given: Yes  Jobe Marker  05/01/2022 11:08 AM

## 2022-05-02 ENCOUNTER — Other Ambulatory Visit: Payer: Self-pay | Admitting: Adult Health

## 2022-05-02 DIAGNOSIS — O3680X0 Pregnancy with inconclusive fetal viability, not applicable or unspecified: Secondary | ICD-10-CM

## 2022-05-02 LAB — BETA HCG QUANT (REF LAB): hCG Quant: 36655 m[IU]/mL

## 2022-05-03 ENCOUNTER — Other Ambulatory Visit: Payer: Self-pay | Admitting: Adult Health

## 2022-05-03 ENCOUNTER — Ambulatory Visit (HOSPITAL_BASED_OUTPATIENT_CLINIC_OR_DEPARTMENT_OTHER)
Admission: RE | Admit: 2022-05-03 | Discharge: 2022-05-03 | Disposition: A | Discharge status: Home / Self Care | Payer: Medicaid Other | Source: Ambulatory Visit | Attending: Adult Health | Admitting: Adult Health

## 2022-05-03 DIAGNOSIS — O3680X0 Pregnancy with inconclusive fetal viability, not applicable or unspecified: Secondary | ICD-10-CM

## 2022-05-03 DIAGNOSIS — O3481 Maternal care for other abnormalities of pelvic organs, first trimester: Secondary | ICD-10-CM | POA: Diagnosis not present

## 2022-05-03 DIAGNOSIS — Z3A12 12 weeks gestation of pregnancy: Secondary | ICD-10-CM | POA: Diagnosis not present

## 2022-05-15 ENCOUNTER — Ambulatory Visit (INDEPENDENT_AMBULATORY_CARE_PROVIDER_SITE_OTHER): Payer: Medicaid Other | Admitting: Women's Health

## 2022-05-15 ENCOUNTER — Ambulatory Visit: Payer: Medicaid Other | Admitting: *Deleted

## 2022-05-15 ENCOUNTER — Encounter: Payer: Self-pay | Admitting: Women's Health

## 2022-05-15 VITALS — BP 105/69 | HR 89 | Wt 159.0 lb

## 2022-05-15 DIAGNOSIS — Z3482 Encounter for supervision of other normal pregnancy, second trimester: Secondary | ICD-10-CM

## 2022-05-15 DIAGNOSIS — Z3480 Encounter for supervision of other normal pregnancy, unspecified trimester: Secondary | ICD-10-CM | POA: Diagnosis not present

## 2022-05-15 DIAGNOSIS — Z8759 Personal history of other complications of pregnancy, childbirth and the puerperium: Secondary | ICD-10-CM | POA: Diagnosis not present

## 2022-05-15 DIAGNOSIS — Z3490 Encounter for supervision of normal pregnancy, unspecified, unspecified trimester: Secondary | ICD-10-CM | POA: Insufficient documentation

## 2022-05-15 DIAGNOSIS — Z6828 Body mass index (BMI) 28.0-28.9, adult: Secondary | ICD-10-CM | POA: Diagnosis not present

## 2022-05-15 DIAGNOSIS — Z3A14 14 weeks gestation of pregnancy: Secondary | ICD-10-CM | POA: Diagnosis not present

## 2022-05-15 LAB — POCT URINALYSIS DIPSTICK OB
Blood, UA: NEGATIVE
Glucose, UA: NEGATIVE
Ketones, UA: NEGATIVE
Leukocytes, UA: NEGATIVE
Nitrite, UA: NEGATIVE
POC,PROTEIN,UA: NEGATIVE

## 2022-05-15 MED ORDER — ASPIRIN 81 MG PO TBEC: 162.0000 mg | DELAYED_RELEASE_TABLET | Freq: Every day | ORAL | 2 refills | Status: DC

## 2022-05-15 NOTE — Progress Notes (Signed)
INITIAL OBSTETRICAL VISIT Patient name: Michele Bates MRN [id number]  Date of birth: 1991-07-17 Chief Complaint:   Initial Prenatal Visit  History of Present Illness:   Michele Bates is a 31 y.o. [id number] Hispanic female at [redacted]w[redacted]d by [place] at 12 weeks with an Estimated Date of Delivery: 11/09/22 being seen today for her initial obstetrical visit.   Patient's last menstrual period was 01/13/2022. Her obstetrical history is significant for  term SVB x 3, GHTN w/ 2nd baby, SAB x 1 .   Today she reports no complaints.  Last pap 06/25/20. Results were: NILM w/ HRHPV negative     05/15/2022    2:47 PM 10/15/2020    9:59 AM 06/25/2020    9:13 AM 09/11/2018    3:26 PM 08/26/2018    1:40 PM  Depression screen PHQ 2/9  Decreased Interest 0 0 0 0 0  Down, Depressed, Hopeless 0 0 0 0 0  PHQ - 2 Score 0 0 0 0 0  Altered sleeping 0 0 0 0   Tired, decreased energy 0 0 0 1   Change in appetite 0 0 0 0   Feeling bad or failure about yourself  0 0 0 0   Trouble concentrating 0 0 0 0   Moving slowly or fidgety/restless 0 0 0 0   Suicidal thoughts 0 0 0 0   PHQ-9 Score 0 0 0 1         05/15/2022    2:47 PM 10/15/2020   10:00 AM 06/25/2020    9:13 AM  GAD 7 : Generalized Anxiety Score  Nervous, Anxious, on Edge 0 0 0  Control/stop worrying 0 0 0  Worry too much - different things 0 0 0  Trouble relaxing 0 0 0  Restless 0 0 0  Easily annoyed or irritable 0 0 0  Afraid - awful might happen 0 0 0  Total GAD 7 Score 0 0 0     Review of Systems:   Pertinent items are noted in HPI Denies cramping/contractions, leakage of fluid, vaginal bleeding, abnormal vaginal discharge w/ itching/odor/irritation, headaches, visual changes, shortness of breath, chest pain, abdominal pain, severe nausea/vomiting, or problems with urination or bowel movements unless otherwise stated above.  Pertinent History Reviewed:  Reviewed past medical,surgical, social, obstetrical and family history.  Reviewed  problem list, medications and allergies. OB History  Gravida Para Term Preterm AB Living  5 3 3   1 3   SAB IAB Ectopic Multiple Live Births  1     0 3    # Outcome Date GA Lbr Len/2nd Weight Sex Delivery Anes PTL Lv  5 Current           4 Term 12/25/20 [redacted]w[redacted]d 00:02 / 00:12 7 lb 15.2 oz (3.606 kg) M Vag-Spont None  LIV  3 Term 03/23/19 [redacted]w[redacted]d 02:45 / 00:21 7 lb 4.1 oz (3.29 kg) F Vag-Spont EPI  LIV     Complications: Gestational hypertension  2 SAB 10/22/17 [redacted]w[redacted]d         1 Term 08/18/11 [redacted]w[redacted]d  7 lb 5 oz (3.317 kg) F Vag-Spont EPI N LIV   Physical Assessment:   Vitals:   05/15/22 1441  BP: 105/69  Pulse: 89  Weight: 159 lb (72.1 kg)  Body mass index is 29.08 kg/m.       Physical Examination:  General appearance - well appearing, and in no distress  Mental status - alert, oriented to person, place, and time  Psych:  She has a normal mood and affect  Skin - warm and dry, normal color, no suspicious lesions noted  Chest - effort normal, all lung fields clear to auscultation bilaterally  Heart - normal rate and regular rhythm  Abdomen - soft, nontender  Extremities:  No swelling or varicosities noted  Thin prep pap is not done   Chaperone: N/A    TODAY'S FHR via doppler: 143  Results for orders placed or performed in visit on 05/15/22 (from the past 24 hour(s))  POC Urinalysis Dipstick OB   Collection Time: 05/15/22  3:13 PM  Result Value Ref Range   Color, UA     Clarity, UA     Glucose, UA Negative Negative   Bilirubin, UA     Ketones, UA neg    Spec Grav, UA     Blood, UA neg    pH, UA     POC,PROTEIN,UA Negative Negative, Trace, Small (1+), Moderate (2+), Large (3+), 4+   Urobilinogen, UA     Nitrite, UA neg    Leukocytes, UA Negative Negative   Appearance     Odor      Assessment & Plan:  1) Low-Risk Pregnancy [id number] at [redacted]w[redacted]d with an Estimated Date of Delivery: 11/09/22   2) Initial OB visit  3) H/O GHTN> ASA 162mg , baseline labs  Meds:  Meds ordered  this encounter  Medications   aspirin EC 81 MG tablet    Sig: Take 2 tablets (162 mg total) by mouth daily. Swallow whole.    Dispense:  180 tablet    Refill:  2    Order Specific Question:   Supervising Provider    Answer:   H [[id number]]    Initial labs obtained Continue prenatal vitamins Reviewed n/v relief measures and warning s/s to report Reviewed recommended weight gain based on pre-gravid BMI Encouraged well-balanced diet Genetic & carrier screening discussed: requests Panorama and AFP, declines NT/IT and Horizon  Ultrasound discussed; fetal survey: requested CCNC completed> form faxed if has or is planning to apply for medicaid The nature of  - [facility] with multiple MDs and other Advanced Practice Providers was explained to patient; also emphasized that fellows, residents, and students are part of our team. Does have home bp cuff. Office bp cuff given: no. Rx sent: n/a. Check bp weekly, let [employer] know if consistently >140/90.   Indications for early A1C (per uptodate) BMI >=25 (>=23 in Asian women) AND one of the following High-risk race/ethnicity (eg, African American, Latino, Native American, [place] American, [place] Islander) Yes  Follow-up: Return in about 4 weeks (around 06/12/2022) for LROB, 08/12/2022, CNM, in person.   Orders Placed This Encounter  Procedures   Urine Culture   GC/Chlamydia Probe Amp   CBC/D/Plt+RPR+Rh+ABO+RubIgG...   Protein / creatinine ratio, urine   Hemoglobin A1c   Comprehensive metabolic panel   Panorama Prenatal Test Full Panel   POC Urinalysis Dipstick OB    [id number] CNM, [facility] 05/15/2022 3:33 PM

## 2022-05-15 NOTE — Patient Instructions (Signed)
Michele Bates, thank you for choosing our office today! We appreciate the opportunity to meet your healthcare needs. You may receive a short survey by mail, e-mail, or through [employer]. If you are happy with your care we would appreciate if you could take just a few minutes to complete the survey questions. We read all of your comments and take your feedback very seriously. Thank you again for choosing our office.  [facility] [employer] Team at [facility] at [facility] ([address] [place], [place] [address]) Entrance C, located off of [address] Free 24/7 valet parking  Go to [email or web address] to register for FREE online childbirth classes  Call the office [phone]) or go to [facility] if: You begin to severe cramping Your water breaks.  Sometimes it is a big gush of fluid, sometimes it is just a trickle that keeps getting your panties wet or running down your legs You have vaginal bleeding.  It is normal to have a small amount of spotting if your cervix was checked.   [facility] Pediatricians/Family Doctors [facility]): [address] Colette Ribas, [phone]           [facility]: [address], [phone]                [facility]): [address], [phone] (call to ask if accepting patients) [facility] Department: [address], [place], [phone]    [facility] Pediatricians/Family Doctors [facility]): [phone] [address], [phone] [facility]: [address] [place], [phone] [facility]: [address], [phone]  [facility] Doctors  [facility]): [phone] [facility]: [address], [phone]   [facility] Doctors [facility]: [address], [phone]  [facility] Doctors  [facility]: [phone], [phone]  Home Blood Pressure Monitoring for Patients   Your provider has recommended that you check your blood pressure (BP) at least once a week at home. If you do not have a blood pressure cuff at home, one will be provided for you. Contact your provider if you have not received your monitor within 1 week.   Helpful Tips for Accurate Home Blood Pressure Checks  Don't smoke, exercise, or drink caffeine 30 minutes before checking your BP Use the restroom before checking your BP (a full bladder can raise your pressure) Relax in a comfortable upright chair Feet on the ground Left arm resting comfortably on a flat surface at the level of your heart Legs uncrossed Back supported Sit quietly and don't talk Place the cuff on your bare arm Adjust snuggly, so that only two fingertips can fit between your skin and the top of the cuff Check 2 readings separated by at least one minute Keep a log of your BP readings For a visual, please reference this diagram: http://ccnc.care/bpdiagram  Provider Name: [facility]     Phone: [phone]  Zone 1: ALL CLEAR  Continue to monitor your symptoms:  BP reading is less than 140 (top number) or less than 90 (bottom number)  No right upper stomach pain No headaches or seeing spots No feeling nauseated or throwing up No swelling in face and hands  Zone 2: CAUTION Call your doctor's office for any of the following:  BP reading is greater than 140 (top number) or greater than  90 (bottom number)  Stomach pain under your ribs in the middle or right side Headaches or seeing spots Feeling nauseated or throwing up Swelling in face and hands  Zone 3: EMERGENCY  Seek immediate medical care if you have any of the following:  BP reading is greater than160 (top number) or greater than 110 (bottom number) Severe headaches not improving with Tylenol Serious difficulty catching your breath Any worsening symptoms from  Zone 2     Second Trimester of Pregnancy The second trimester is from week 14 through week 27 (months 4 through 6). The second trimester is often a time when you feel your best. Your body has adjusted to being pregnant, and you begin to feel better physically. Usually, morning sickness has lessened or quit completely, you may have more energy, and you may have an increase in appetite. The second trimester is also a time when the fetus is growing rapidly. At the end of the sixth month, the fetus is about 9 inches long and weighs about 1 pounds. You will likely begin to feel the baby move (quickening) between 16 and 20 weeks of pregnancy. Body changes during your second trimester Your body continues to go through many changes during your second trimester. The changes vary from woman to woman. Your weight will continue to increase. You will notice your lower abdomen bulging out. You may begin to get stretch marks on your hips, abdomen, and breasts. You may develop headaches that can be relieved by medicines. The medicines should be approved by your health care provider. You may urinate more often because the fetus is pressing on your bladder. You may develop or continue to have heartburn as a result of your pregnancy. You may develop constipation because certain hormones are causing the muscles that push waste through your intestines to slow down. You may develop hemorrhoids or swollen, bulging veins (varicose veins). You may have back pain. This is caused by: Weight gain. Pregnancy hormones that are relaxing the joints in your pelvis. A shift in weight and the muscles that support your balance. Your breasts will continue to grow and they will continue to become tender. Your gums may bleed and may be sensitive to brushing and flossing. Dark spots or blotches (chloasma, mask of pregnancy) may develop on your face. This will likely fade after the baby is born. A dark line from your belly button to  the pubic area (linea nigra) may appear. This will likely fade after the baby is born. You may have changes in your hair. These can include thickening of your hair, rapid growth, and changes in texture. Some women also have hair loss during or after pregnancy, or hair that feels dry or thin. Your hair will most likely return to normal after your baby is born.  What to expect at prenatal visits During a routine prenatal visit: You will be weighed to make sure you and the fetus are growing normally. Your blood pressure will be taken. Your abdomen will be measured to track your baby's growth. The fetal heartbeat will be listened to. Any test results from the previous visit will be discussed.  Your health care provider may ask you: How you are feeling. If you are feeling the baby move. If you have had any abnormal symptoms, such as leaking fluid, bleeding, severe headaches, or abdominal cramping. If you are using any tobacco products, including cigarettes, chewing tobacco, and electronic cigarettes. If you have any questions.  Other tests that may be performed during   your second trimester include: Blood tests that check for: Low iron levels (anemia). High blood sugar that affects pregnant women (gestational diabetes) between 24 and 28 weeks. Rh antibodies. This is to check for a protein on red blood cells (Rh factor). Urine tests to check for infections, diabetes, or protein in the urine. An ultrasound to confirm the proper growth and development of the baby. An amniocentesis to check for possible genetic problems. Fetal screens for spina bifida and Down syndrome. HIV (human immunodeficiency virus) testing. Routine prenatal testing includes screening for HIV, unless you choose not to have this test.  Follow these instructions at home: Medicines Follow your health care provider's instructions regarding medicine use. Specific medicines may be either safe or unsafe to take during  pregnancy. Take a prenatal vitamin that contains at least 600 micrograms (mcg) of folic acid. If you develop constipation, try taking a stool softener if your health care provider approves. Eating and drinking Eat a balanced diet that includes fresh fruits and vegetables, whole grains, good sources of protein such as meat, eggs, or tofu, and low-fat dairy. Your health care provider will help you determine the amount of weight gain that is right for you. Avoid raw meat and uncooked cheese. These carry germs that can cause birth defects in the baby. If you have low calcium intake from food, talk to your health care provider about whether you should take a daily calcium supplement. Limit foods that are high in fat and processed sugars, such as fried and sweet foods. To prevent constipation: Drink enough fluid to keep your urine clear or pale yellow. Eat foods that are high in fiber, such as fresh fruits and vegetables, whole grains, and beans. Activity Exercise only as directed by your health care provider. Most women can continue their usual exercise routine during pregnancy. Try to exercise for 30 minutes at least 5 days a week. Stop exercising if you experience uterine contractions. Avoid heavy lifting, wear low heel shoes, and practice good posture. A sexual relationship may be continued unless your health care provider directs you otherwise. Relieving pain and discomfort Wear a good support bra to prevent discomfort from breast tenderness. Take warm sitz baths to soothe any pain or discomfort caused by hemorrhoids. Use hemorrhoid cream if your health care provider approves. Rest with your legs elevated if you have leg cramps or low back pain. If you develop varicose veins, wear support hose. Elevate your feet for 15 minutes, 3-4 times a day. Limit salt in your diet. Prenatal Care Write down your questions. Take them to your prenatal visits. Keep all your prenatal visits as told by your health  care provider. This is important. Safety Wear your seat belt at all times when driving. Make a list of emergency phone numbers, including numbers for family, friends, the hospital, and police and fire departments. General instructions Ask your health care provider for a referral to a local prenatal education class. Begin classes no later than the beginning of month 6 of your pregnancy. Ask for help if you have counseling or nutritional needs during pregnancy. Your health care provider can offer advice or refer you to specialists for help with various needs. Do not use hot tubs, steam rooms, or saunas. Do not douche or use tampons or scented sanitary pads. Do not cross your legs for long periods of time. Avoid cat litter boxes and soil used by cats. These carry germs that can cause birth defects in the baby and possibly loss of the   fetus by miscarriage or stillbirth. Avoid all smoking, herbs, alcohol, and unprescribed drugs. Chemicals in these products can affect the formation and growth of the baby. Do not use any products that contain nicotine or tobacco, such as cigarettes and e-cigarettes. If you need help quitting, ask your health care provider. Visit your dentist if you have not gone yet during your pregnancy. Use a soft toothbrush to brush your teeth and be gentle when you floss. Contact a health care provider if: You have dizziness. You have mild pelvic cramps, pelvic pressure, or nagging pain in the abdominal area. You have persistent nausea, vomiting, or diarrhea. You have a bad smelling vaginal discharge. You have pain when you urinate. Get help right away if: You have a fever. You are leaking fluid from your vagina. You have spotting or bleeding from your vagina. You have severe abdominal cramping or pain. You have rapid weight gain or weight loss. You have shortness of breath with chest pain. You notice sudden or extreme swelling of your face, hands, ankles, feet, or legs. You  have not felt your baby move in over an hour. You have severe headaches that do not go away when you take medicine. You have vision changes. Summary The second trimester is from week 14 through week 27 (months 4 through 6). It is also a time when the fetus is growing rapidly. Your body goes through many changes during pregnancy. The changes vary from woman to woman. Avoid all smoking, herbs, alcohol, and unprescribed drugs. These chemicals affect the formation and growth your baby. Do not use any tobacco products, such as cigarettes, chewing tobacco, and e-cigarettes. If you need help quitting, ask your health care provider. Contact your health care provider if you have any questions. Keep all prenatal visits as told by your health care provider. This is important. This information is not intended to replace advice given to you by your health care provider. Make sure you discuss any questions you have with your health care provider. Document Released: 10/17/2001 Document Revised: 03/30/2016 Document Reviewed: 12/24/2012 Elsevier Interactive Patient Education  2017 [employer]  

## 2022-05-16 LAB — CBC/D/PLT+RPR+RH+ABO+RUBIGG...
Antibody Screen: NEGATIVE
Basophils Absolute: 0 10*3/uL (ref 0.0–0.2)
Basos: 0 %
EOS (ABSOLUTE): 0.1 10*3/uL (ref 0.0–0.4)
Eos: 1 %
HCV Ab: NONREACTIVE
HIV Screen 4th Generation wRfx: NONREACTIVE
Hematocrit: 37.4 % (ref 34.0–46.6)
Hemoglobin: 12.9 g/dL (ref 11.1–15.9)
Hepatitis B Surface Ag: NEGATIVE
Immature Grans (Abs): 0 10*3/uL (ref 0.0–0.1)
Immature Granulocytes: 1 %
Lymphocytes Absolute: 1.6 10*3/uL (ref 0.7–3.1)
Lymphs: 21 %
MCH: 31.2 pg (ref 26.6–33.0)
MCHC: 34.5 g/dL (ref 31.5–35.7)
MCV: 90 fL (ref 79–97)
Monocytes Absolute: 0.4 10*3/uL (ref 0.1–0.9)
Monocytes: 6 %
Neutrophils Absolute: 5.3 10*3/uL (ref 1.4–7.0)
Neutrophils: 71 %
Platelets: 245 10*3/uL (ref 150–450)
RBC: 4.14 x10E6/uL (ref 3.77–5.28)
RDW: 12.4 % (ref 11.7–15.4)
RPR Ser Ql: NONREACTIVE
Rh Factor: POSITIVE
Rubella Antibodies, IGG: 2.43 index (ref >0.99)
WBC: 7.5 10*3/uL (ref 3.4–10.8)

## 2022-05-16 LAB — HCV INTERPRETATION

## 2022-05-16 LAB — COMPREHENSIVE METABOLIC PANEL
ALT: 8 IU/L (ref 0–32)
AST: 9 IU/L (ref 0–40)
Albumin/Globulin Ratio: 1.7 (ref 1.2–2.2)
Albumin: 4.2 g/dL (ref 4.0–5.0)
Alkaline Phosphatase: 57 IU/L (ref 44–121)
BUN/Creatinine Ratio: 14 (ref 9–23)
BUN: 7 mg/dL (ref 6–20)
Bilirubin Total: 0.3 mg/dL (ref 0.0–1.2)
CO2: 22 mmol/L (ref 20–29)
Calcium: 9.1 mg/dL (ref 8.7–10.2)
Chloride: 101 mmol/L (ref 96–106)
Creatinine, Ser: 0.51 mg/dL — ABNORMAL LOW (ref 0.57–1.00)
Globulin, Total: 2.5 g/dL (ref 1.5–4.5)
Glucose: 69 mg/dL — ABNORMAL LOW (ref 70–99)
Potassium: 4 mmol/L (ref 3.5–5.2)
Sodium: 140 mmol/L (ref 134–144)
Total Protein: 6.7 g/dL (ref 6.0–8.5)
eGFR: 130 mL/min/{1.73_m2} (ref >59)

## 2022-05-16 LAB — PROTEIN / CREATININE RATIO, URINE
Creatinine, Urine: 158.7 mg/dL
Protein, Ur: 14.9 mg/dL
Protein/Creat Ratio: 94 mg/g creat (ref 0–200)

## 2022-05-16 LAB — HEMOGLOBIN A1C
Est. average glucose Bld gHb Est-mCnc: 105 mg/dL
Hgb A1c MFr Bld: 5.3 % (ref 4.8–5.6)

## 2022-05-17 LAB — URINE CULTURE

## 2022-05-17 LAB — GC/CHLAMYDIA PROBE AMP
Chlamydia trachomatis, NAA: NEGATIVE
Neisseria Gonorrhoeae by PCR: NEGATIVE

## 2022-05-24 LAB — PANORAMA PRENATAL TEST FULL PANEL:PANORAMA TEST PLUS 5 ADDITIONAL MICRODELETIONS: FETAL FRACTION: 8.5

## 2022-06-08 ENCOUNTER — Other Ambulatory Visit: Payer: Self-pay | Admitting: Women's Health

## 2022-06-08 DIAGNOSIS — Z363 Encounter for antenatal screening for malformations: Secondary | ICD-10-CM

## 2022-06-12 ENCOUNTER — Ambulatory Visit (INDEPENDENT_AMBULATORY_CARE_PROVIDER_SITE_OTHER): Payer: Medicaid Other

## 2022-06-12 ENCOUNTER — Encounter: Payer: Self-pay | Admitting: Women's Health

## 2022-06-12 ENCOUNTER — Ambulatory Visit (INDEPENDENT_AMBULATORY_CARE_PROVIDER_SITE_OTHER): Payer: Medicaid Other | Admitting: Women's Health

## 2022-06-12 VITALS — BP 107/69 | HR 78 | Wt 160.0 lb

## 2022-06-12 DIAGNOSIS — Z3482 Encounter for supervision of other normal pregnancy, second trimester: Secondary | ICD-10-CM

## 2022-06-12 DIAGNOSIS — Z3A18 18 weeks gestation of pregnancy: Secondary | ICD-10-CM | POA: Diagnosis not present

## 2022-06-12 DIAGNOSIS — Z363 Encounter for antenatal screening for malformations: Secondary | ICD-10-CM

## 2022-06-12 DIAGNOSIS — Z3480 Encounter for supervision of other normal pregnancy, unspecified trimester: Secondary | ICD-10-CM

## 2022-06-12 NOTE — Patient Instructions (Signed)
Michele Bates, thank you for choosing our office today! We appreciate the opportunity to meet your healthcare needs. You may receive a short survey by mail, e-mail, or through [employer]. If you are happy with your care we would appreciate if you could take just a few minutes to complete the survey questions. We read all of your comments and take your feedback very seriously. Thank you again for choosing our office.  Center for [employer] Team at [facility] at [facility] ([address] [place], [place] [address]) Entrance C, located off of [address] Free 24/7 valet parking  Go to [email or web address] to register for FREE online childbirth classes  Call the office [phone]) or go to [facility] if: You begin to severe cramping Your water breaks.  Sometimes it is a big gush of fluid, sometimes it is just a trickle that keeps getting your panties wet or running down your legs You have vaginal bleeding.  It is normal to have a small amount of spotting if your cervix was checked.   [facility] Pediatricians/Family Doctors [facility]): [address] Colette Ribas, [phone]           [facility]: [address], [phone]                [facility] Medicine [facility]): [address], [phone] (call to ask if accepting patients) [facility] Department: [address], [place], [phone]    [facility] Pediatricians/Family Doctors [facility]): [phone] [address], [phone] [facility]: [address] [place], [phone] [facility]: [address], [phone]  [facility] Doctors  [facility]): [phone] [facility]: [address], [phone]   [facility] Doctors [facility]: [address], [phone]  [facility] Doctors  [facility]: [phone], [phone]  Home Blood Pressure Monitoring for Patients   Your provider has recommended that you check your blood pressure (BP) at least once a week at home. If you do not have a blood pressure cuff at home, one will be provided for you. Contact your provider if you have not received your monitor within 1 week.   Helpful Tips for Accurate Home Blood Pressure Checks  Don't smoke, exercise, or drink caffeine 30 minutes before checking your BP Use the restroom before checking your BP (a full bladder can raise your pressure) Relax in a comfortable upright chair Feet on the ground Left arm resting comfortably on a flat surface at the level of your heart Legs uncrossed Back supported Sit quietly and don't talk Place the cuff on your bare arm Adjust snuggly, so that only two fingertips can fit between your skin and the top of the cuff Check 2 readings separated by at least one minute Keep a log of your BP readings For a visual, please reference this diagram: http://ccnc.care/bpdiagram  Provider Name: [facility]     Phone: [phone]  Zone 1: ALL CLEAR  Continue to monitor your symptoms:  BP reading is less than 140 (top number) or less than 90 (bottom number)  No right upper stomach pain No headaches or seeing spots No feeling nauseated or throwing up No swelling in face and hands  Zone 2: CAUTION Call your doctor's office for any of the following:  BP reading is greater than 140 (top number) or greater than  90 (bottom number)  Stomach pain under your ribs in the middle or right side Headaches or seeing spots Feeling nauseated or throwing up Swelling in face and hands  Zone 3: EMERGENCY  Seek immediate medical care if you have any of the following:  BP reading is greater than160 (top number) or greater than 110 (bottom number) Severe headaches not improving with Tylenol Serious difficulty catching your breath Any worsening symptoms from  Zone 2     Second Trimester of Pregnancy The second trimester is from week 14 through week 27 (months 4 through 6). The second trimester is often a time when you feel your best. Your body has adjusted to being pregnant, and you begin to feel better physically. Usually, morning sickness has lessened or quit completely, you may have more energy, and you may have an increase in appetite. The second trimester is also a time when the fetus is growing rapidly. At the end of the sixth month, the fetus is about 9 inches long and weighs about 1 pounds. You will likely begin to feel the baby move (quickening) between 16 and 20 weeks of pregnancy. Body changes during your second trimester Your body continues to go through many changes during your second trimester. The changes vary from woman to woman. Your weight will continue to increase. You will notice your lower abdomen bulging out. You may begin to get stretch marks on your hips, abdomen, and breasts. You may develop headaches that can be relieved by medicines. The medicines should be approved by your health care provider. You may urinate more often because the fetus is pressing on your bladder. You may develop or continue to have heartburn as a result of your pregnancy. You may develop constipation because certain hormones are causing the muscles that push waste through your intestines to slow down. You may develop hemorrhoids or swollen, bulging veins (varicose veins). You may have back pain. This is caused by: Weight gain. Pregnancy hormones that are relaxing the joints in your pelvis. A shift in weight and the muscles that support your balance. Your breasts will continue to grow and they will continue to become tender. Your gums may bleed and may be sensitive to brushing and flossing. Dark spots or blotches (chloasma, mask of pregnancy) may develop on your face. This will likely fade after the baby is born. A dark line from your belly button to  the pubic area (linea nigra) may appear. This will likely fade after the baby is born. You may have changes in your hair. These can include thickening of your hair, rapid growth, and changes in texture. Some women also have hair loss during or after pregnancy, or hair that feels dry or thin. Your hair will most likely return to normal after your baby is born.  What to expect at prenatal visits During a routine prenatal visit: You will be weighed to make sure you and the fetus are growing normally. Your blood pressure will be taken. Your abdomen will be measured to track your baby's growth. The fetal heartbeat will be listened to. Any test results from the previous visit will be discussed.  Your health care provider may ask you: How you are feeling. If you are feeling the baby move. If you have had any abnormal symptoms, such as leaking fluid, bleeding, severe headaches, or abdominal cramping. If you are using any tobacco products, including cigarettes, chewing tobacco, and electronic cigarettes. If you have any questions.  Other tests that may be performed during   your second trimester include: Blood tests that check for: Low iron levels (anemia). High blood sugar that affects pregnant women (gestational diabetes) between 24 and 28 weeks. Rh antibodies. This is to check for a protein on red blood cells (Rh factor). Urine tests to check for infections, diabetes, or protein in the urine. An ultrasound to confirm the proper growth and development of the baby. An amniocentesis to check for possible genetic problems. Fetal screens for spina bifida and Down syndrome. HIV (human immunodeficiency virus) testing. Routine prenatal testing includes screening for HIV, unless you choose not to have this test.  Follow these instructions at home: Medicines Follow your health care provider's instructions regarding medicine use. Specific medicines may be either safe or unsafe to take during  pregnancy. Take a prenatal vitamin that contains at least 600 micrograms (mcg) of folic acid. If you develop constipation, try taking a stool softener if your health care provider approves. Eating and drinking Eat a balanced diet that includes fresh fruits and vegetables, whole grains, good sources of protein such as meat, eggs, or tofu, and low-fat dairy. Your health care provider will help you determine the amount of weight gain that is right for you. Avoid raw meat and uncooked cheese. These carry germs that can cause birth defects in the baby. If you have low calcium intake from food, talk to your health care provider about whether you should take a daily calcium supplement. Limit foods that are high in fat and processed sugars, such as fried and sweet foods. To prevent constipation: Drink enough fluid to keep your urine clear or pale yellow. Eat foods that are high in fiber, such as fresh fruits and vegetables, whole grains, and beans. Activity Exercise only as directed by your health care provider. Most women can continue their usual exercise routine during pregnancy. Try to exercise for 30 minutes at least 5 days a week. Stop exercising if you experience uterine contractions. Avoid heavy lifting, wear low heel shoes, and practice good posture. A sexual relationship may be continued unless your health care provider directs you otherwise. Relieving pain and discomfort Wear a good support bra to prevent discomfort from breast tenderness. Take warm sitz baths to soothe any pain or discomfort caused by hemorrhoids. Use hemorrhoid cream if your health care provider approves. Rest with your legs elevated if you have leg cramps or low back pain. If you develop varicose veins, wear support hose. Elevate your feet for 15 minutes, 3-4 times a day. Limit salt in your diet. Prenatal Care Write down your questions. Take them to your prenatal visits. Keep all your prenatal visits as told by your health  care provider. This is important. Safety Wear your seat belt at all times when driving. Make a list of emergency phone numbers, including numbers for family, friends, the hospital, and police and fire departments. General instructions Ask your health care provider for a referral to a local prenatal education class. Begin classes no later than the beginning of month 6 of your pregnancy. Ask for help if you have counseling or nutritional needs during pregnancy. Your health care provider can offer advice or refer you to specialists for help with various needs. Do not use hot tubs, steam rooms, or saunas. Do not douche or use tampons or scented sanitary pads. Do not cross your legs for long periods of time. Avoid cat litter boxes and soil used by cats. These carry germs that can cause birth defects in the baby and possibly loss of the   fetus by miscarriage or stillbirth. Avoid all smoking, herbs, alcohol, and unprescribed drugs. Chemicals in these products can affect the formation and growth of the baby. Do not use any products that contain nicotine or tobacco, such as cigarettes and e-cigarettes. If you need help quitting, ask your health care provider. Visit your dentist if you have not gone yet during your pregnancy. Use a soft toothbrush to brush your teeth and be gentle when you floss. Contact a health care provider if: You have dizziness. You have mild pelvic cramps, pelvic pressure, or nagging pain in the abdominal area. You have persistent nausea, vomiting, or diarrhea. You have a bad smelling vaginal discharge. You have pain when you urinate. Get help right away if: You have a fever. You are leaking fluid from your vagina. You have spotting or bleeding from your vagina. You have severe abdominal cramping or pain. You have rapid weight gain or weight loss. You have shortness of breath with chest pain. You notice sudden or extreme swelling of your face, hands, ankles, feet, or legs. You  have not felt your baby move in over an hour. You have severe headaches that do not go away when you take medicine. You have vision changes. Summary The second trimester is from week 14 through week 27 (months 4 through 6). It is also a time when the fetus is growing rapidly. Your body goes through many changes during pregnancy. The changes vary from woman to woman. Avoid all smoking, herbs, alcohol, and unprescribed drugs. These chemicals affect the formation and growth your baby. Do not use any tobacco products, such as cigarettes, chewing tobacco, and e-cigarettes. If you need help quitting, ask your health care provider. Contact your health care provider if you have any questions. Keep all prenatal visits as told by your health care provider. This is important. This information is not intended to replace advice given to you by your health care provider. Make sure you discuss any questions you have with your health care provider. Document Released: 10/17/2001 Document Revised: 03/30/2016 Document Reviewed: 12/24/2012 Elsevier Interactive Patient Education  2017 Elsevier Inc.  

## 2022-06-12 NOTE — Progress Notes (Signed)
LOW-RISK PREGNANCY VISIT Patient name: Michele Bates MRN [id number]  Date of birth: [birth year] Chief Complaint:   Routine Prenatal Visit (Anatomy scan)  History of Present Illness:   Michele Bates is a 31 y.o. [id number] female at [redacted]w[redacted]d with an Estimated Date of Delivery: 11/09/22 being seen today for ongoing management of a low-risk pregnancy.   Today she reports no complaints. Contractions: Not present. Vag. Bleeding: None.  Movement: Present. denies leaking of fluid.     05/15/2022    2:47 PM 10/15/2020    9:59 AM 06/25/2020    9:13 AM 09/11/2018    3:26 PM 08/26/2018    1:40 PM  Depression screen PHQ 2/9  Decreased Interest 0 0 0 0 0  Down, Depressed, Hopeless 0 0 0 0 0  PHQ - 2 Score 0 0 0 0 0  Altered sleeping 0 0 0 0   Tired, decreased energy 0 0 0 1   Change in appetite 0 0 0 0   Feeling bad or failure about yourself  0 0 0 0   Trouble concentrating 0 0 0 0   Moving slowly or fidgety/restless 0 0 0 0   Suicidal thoughts 0 0 0 0   PHQ-9 Score 0 0 0 1         05/15/2022    2:47 PM 10/15/2020   10:00 AM 06/25/2020    9:13 AM  GAD 7 : Generalized Anxiety Score  Nervous, Anxious, on Edge 0 0 0  Control/stop worrying 0 0 0  Worry too much - different things 0 0 0  Trouble relaxing 0 0 0  Restless 0 0 0  Easily annoyed or irritable 0 0 0  Afraid - awful might happen 0 0 0  Total GAD 7 Score 0 0 0      Review of Systems:   Pertinent items are noted in HPI Denies abnormal vaginal discharge w/ itching/odor/irritation, headaches, visual changes, shortness of breath, chest pain, abdominal pain, severe nausea/vomiting, or problems with urination or bowel movements unless otherwise stated above. Pertinent History Reviewed:  Reviewed past medical,surgical, social, obstetrical and family history.  Reviewed problem list, medications and allergies. Physical Assessment:   Vitals:   06/12/22 0916  BP: 107/69  Pulse: 78  Weight: 160 lb (72.6 kg)  Body mass index is  29.26 kg/m.        Physical Examination:   General appearance: Well appearing, and in no distress  Mental status: Alert, oriented to person, place, and time  Skin: Warm & dry  Cardiovascular: Normal heart rate noted  Respiratory: Normal respiratory effort, no distress  Abdomen: Soft, gravid, nontender  Pelvic: Cervical exam deferred         Extremities: Edema: None  Fetal Status: Fetal Heart Rate (bpm): 137 u/s   Movement: Present  [place] 18+4 wks,breech,cx 3.9 cm,anterior placenta gr 0,normal ovaries,SVP of fluid 3.8 cm,FHR 137 bpm,EFW 267 g 57%,anatomy complete,no obvious abnormalities   Chaperone: N/A   No results found for this or any previous visit (from the past 24 hour(s)).  Assessment & Plan:  1) Low-risk pregnancy [id number] at [redacted]w[redacted]d with an Estimated Date of Delivery: 11/09/22   2) H/O GHTN, ASA   Meds: No orders of the defined types were placed in this encounter.  Labs/procedures today: U/S  Plan:  Continue routine obstetrical care  Next visit: prefers in person    Reviewed: Preterm labor symptoms and general obstetric precautions including but not limited to vaginal bleeding,  contractions, leaking of fluid and fetal movement were reviewed in detail with the patient.  All questions were answered. Does have home bp cuff. Office bp cuff given: not applicable. Check bp weekly, let us know if consistently >140 and/or >90.  Follow-up: Return in about 4 weeks (around 07/10/2022) for LROB, CNM, in person.  No future appointments.  No orders of the defined types were placed in this encounter.  Cheral Marker CNM, [facility] 06/12/2022 9:50 AM

## 2022-06-12 NOTE — Progress Notes (Signed)
[place] 18+4 wks,breech,cx 3.9 cm,anterior placenta gr 0,normal ovaries,SVP of fluid 3.8 cm,FHR 137 bpm,EFW 267 g 57%,anatomy complete,no obvious abnormalities

## 2022-07-12 ENCOUNTER — Encounter: Payer: Self-pay | Admitting: Advanced Practice Midwife

## 2022-07-12 ENCOUNTER — Ambulatory Visit (INDEPENDENT_AMBULATORY_CARE_PROVIDER_SITE_OTHER): Payer: Medicaid Other | Admitting: Advanced Practice Midwife

## 2022-07-12 VITALS — BP 111/60 | HR 88 | Wt 165.0 lb

## 2022-07-12 DIAGNOSIS — Z3482 Encounter for supervision of other normal pregnancy, second trimester: Secondary | ICD-10-CM

## 2022-07-12 DIAGNOSIS — Z3A22 22 weeks gestation of pregnancy: Secondary | ICD-10-CM

## 2022-07-12 NOTE — Patient Instructions (Signed)

## 2022-07-12 NOTE — Progress Notes (Signed)
   LOW-RISK PREGNANCY VISIT Patient name: Michele Bates MRN [id number]  Date of birth: 11-Apr-1991 Chief Complaint:   Routine Prenatal Visit  History of Present Illness:   Michele Bates is a 31 y.o. [id number] female at [redacted]w[redacted]d with an Estimated Date of Delivery: 11/09/22 being seen today for ongoing management of a low-risk pregnancy.  Today she reports no complaints. Contractions: Not present. Vag. Bleeding: None.  Movement: Present. denies leaking of fluid. Review of Systems:   Pertinent items are noted in HPI Denies abnormal vaginal discharge w/ itching/odor/irritation, headaches, visual changes, shortness of breath, chest pain, abdominal pain, severe nausea/vomiting, or problems with urination or bowel movements unless otherwise stated above. Pertinent History Reviewed:  Reviewed past medical,surgical, social, obstetrical and family history.  Reviewed problem list, medications and allergies. Physical Assessment:   Vitals:   07/12/22 1102  BP: 111/60  Pulse: 88  Weight: 165 lb (74.8 kg)  Body mass index is 30.18 kg/m.        Physical Examination:   General appearance: Well appearing, and in no distress  Mental status: Alert, oriented to person, place, and time  Skin: Warm & dry  Cardiovascular: Normal heart rate noted  Respiratory: Normal respiratory effort, no distress  Abdomen: Soft, gravid, nontender  Pelvic: Cervical exam deferred         Extremities: Edema: None  Fetal Status: Fetal Heart Rate (bpm): 142   Movement: Present    Chaperone: n/a    No results found for this or any previous visit (from the past 24 hour(s)).  Assessment & Plan:  1) Low-risk pregnancy [id number] at [redacted]w[redacted]d with an Estimated Date of Delivery: 11/09/22      Meds: No orders of the defined types were placed in this encounter.  Labs/procedures today: none  Plan:  Continue routine obstetrical care  Next visit: prefers in person    Reviewed: Preterm labor symptoms and general obstetric  precautions including but not limited to vaginal bleeding, contractions, leaking of fluid and fetal movement were reviewed in detail with the patient.  All questions were answered. Has home bp cuff.. Check bp weekly, let us know if >140/90.   Follow-up: Return in about 4 weeks (around 08/09/2022) for PN2/LROB.  No orders of the defined types were placed in this encounter.  Jacklyn Shell DNP, CNM 07/12/2022 11:11 AM

## 2022-08-14 ENCOUNTER — Ambulatory Visit (INDEPENDENT_AMBULATORY_CARE_PROVIDER_SITE_OTHER): Payer: Medicaid Other | Admitting: Women's Health

## 2022-08-14 ENCOUNTER — Other Ambulatory Visit: Payer: Medicaid Other

## 2022-08-14 ENCOUNTER — Encounter: Payer: Self-pay | Admitting: Women's Health

## 2022-08-14 VITALS — BP 107/65 | HR 90 | Wt 167.0 lb

## 2022-08-14 DIAGNOSIS — Z131 Encounter for screening for diabetes mellitus: Secondary | ICD-10-CM | POA: Diagnosis not present

## 2022-08-14 DIAGNOSIS — Z23 Encounter for immunization: Secondary | ICD-10-CM | POA: Diagnosis not present

## 2022-08-14 DIAGNOSIS — Z3482 Encounter for supervision of other normal pregnancy, second trimester: Secondary | ICD-10-CM

## 2022-08-14 DIAGNOSIS — Z3480 Encounter for supervision of other normal pregnancy, unspecified trimester: Secondary | ICD-10-CM

## 2022-08-14 DIAGNOSIS — Z3A27 27 weeks gestation of pregnancy: Secondary | ICD-10-CM

## 2022-08-14 DIAGNOSIS — Z3A26 26 weeks gestation of pregnancy: Secondary | ICD-10-CM | POA: Diagnosis not present

## 2022-08-14 NOTE — Progress Notes (Signed)
LOW-RISK PREGNANCY VISIT Patient name: Michele Bates MRN [id number]  Date of birth: 1991/02/02 Chief Complaint:   Routine Prenatal Visit (PN2)  History of Present Illness:   Michele Bates is a 31 y.o. [id number] female at [redacted]w[redacted]d with an Estimated Date of Delivery: 11/09/22 being seen today for ongoing management of a low-risk pregnancy.   Today she reports no complaints. Contractions: Not present. Vag. Bleeding: None.  Movement: Present. denies leaking of fluid.     08/14/2022    9:38 AM 05/15/2022    2:47 PM 10/15/2020    9:59 AM 06/25/2020    9:13 AM 09/11/2018    3:26 PM  Depression screen PHQ 2/9  Decreased Interest 0 0 0 0 0  Down, Depressed, Hopeless 0 0 0 0 0  PHQ - 2 Score 0 0 0 0 0  Altered sleeping 0 0 0 0 0  Tired, decreased energy 0 0 0 0 1  Change in appetite 0 0 0 0 0  Feeling bad or failure about yourself  0 0 0 0 0  Trouble concentrating 0 0 0 0 0  Moving slowly or fidgety/restless 0 0 0 0 0  Suicidal thoughts 0 0 0 0 0  PHQ-9 Score 0 0 0 0 1        08/14/2022    9:39 AM 05/15/2022    2:47 PM 10/15/2020   10:00 AM 06/25/2020    9:13 AM  GAD 7 : Generalized Anxiety Score  Nervous, Anxious, on Edge 0 0 0 0  Control/stop worrying 0 0 0 0  Worry too much - different things 0 0 0 0  Trouble relaxing 0 0 0 0  Restless 0 0 0 0  Easily annoyed or irritable 0 0 0 0  Afraid - awful might happen 0 0 0 0  Total GAD 7 Score 0 0 0 0      Review of Systems:   Pertinent items are noted in HPI Denies abnormal vaginal discharge w/ itching/odor/irritation, headaches, visual changes, shortness of breath, chest pain, abdominal pain, severe nausea/vomiting, or problems with urination or bowel movements unless otherwise stated above. Pertinent History Reviewed:  Reviewed past medical,surgical, social, obstetrical and family history.  Reviewed problem list, medications and allergies. Physical Assessment:   Vitals:   08/14/22 0942  BP: 107/65  Pulse: 90  Weight:  167 lb (75.8 kg)  Body mass index is 30.54 kg/m.        Physical Examination:   General appearance: Well appearing, and in no distress  Mental status: Alert, oriented to person, place, and time  Skin: Warm & dry  Cardiovascular: Normal heart rate noted  Respiratory: Normal respiratory effort, no distress  Abdomen: Soft, gravid, nontender  Pelvic: Cervical exam deferred         Extremities: Edema: None  Fetal Status: Fetal Heart Rate (bpm): 148 Fundal Height: 29 cm Movement: Present    Chaperone: N/A   No results found for this or any previous visit (from the past 24 hour(s)).  Assessment & Plan:  1) Low-risk pregnancy [id number] at [redacted]w[redacted]d with an Estimated Date of Delivery: 11/09/22   2) Wants BTL>reviewed risks/benefits, discussed high incidence regret <30yo if appropriate, LARCs just as effective, consent signed today   3) H/O GHTN> ASA   Meds: No orders of the defined types were placed in this encounter.  Labs/procedures today: tdap, PN2, and declined flu shot  Plan:  Continue routine obstetrical care  Next visit: prefers in person  Reviewed: Preterm labor symptoms and general obstetric precautions including but not limited to vaginal bleeding, contractions, leaking of fluid and fetal movement were reviewed in detail with the patient.  All questions were answered. Does have home bp cuff. Office bp cuff given: not applicable. Check bp weekly, let us know if consistently >140 and/or >90.  Follow-up: Return in about 3 weeks (around 09/04/2022) for LROB, CNM, in person.  Future Appointments  Date Time Provider Department Center  09/04/2022  9:50 AM Cheral Marker, CNM [facility]    Orders Placed This Encounter  Procedures   Tdap vaccine greater than or equal to 7yo IM   Cheral Marker CNM, [facility] 08/14/2022 10:07 AM

## 2022-08-14 NOTE — Patient Instructions (Signed)
Chrissie Noa, thank you for choosing our office today! We appreciate the opportunity to meet your healthcare needs. You may receive a short survey by mail, e-mail, or through [employer]. If you are happy with your care we would appreciate if you could take just a few minutes to complete the survey questions. We read all of your comments and take your feedback very seriously. Thank you again for choosing our office.  Center for [employer] Team at [place] at [facility] ([place], [place] [address]) Entrance C, located off of [address] parking   CLASSES: Go to [email or web address] to register for classes (childbirth, breastfeeding, waterbirth, infant CPR, daddy bootcamp, etc.)  Call the office [phone]) or go to [facility] if: You begin to have strong, frequent contractions Your water breaks.  Sometimes it is a big gush of fluid, sometimes it is just a trickle that keeps getting your panties wet or running down your legs You have vaginal bleeding.  It is normal to have a small amount of spotting if your cervix was checked.  You don't feel your baby moving like normal.  If you don't, get you something to eat and drink and lay down and focus on feeling your baby move.   If your baby is still not moving like normal, you should call the office or go to [facility]  Call the office [phone]) or go to [facility] hospital for these signs of pre-eclampsia: Severe headache that does not go away with Tylenol Visual changes- seeing spots, double, blurred vision Pain under your right breast or upper abdomen that does not go away with Tums or heartburn medicine Nausea and/or vomiting Severe swelling in your hands, feet, and face   Tdap Vaccine It is recommended that you get the Tdap vaccine during the third trimester of EACH pregnancy to help protect your baby from getting pertussis (whooping cough) 27-36 weeks is the BEST time to do  this so that you can pass the protection on to your baby. During pregnancy is better than after pregnancy, but if you are unable to get it during pregnancy it will be offered at the hospital.  You can get this vaccine with [place], at the health department, your family doctor, or some local pharmacies Everyone who will be around your baby should also be up-to-date on their vaccines before the baby comes. Adults (who are not pregnant) only need 1 dose of Tdap during adulthood.   [facility] Pediatricians/Family Doctors [facility]): [address] [place], [place] [facility]: [address] [place], [phone]                [place] [facility]): [place], [phone] (call to ask if accepting patients) [facility] Department: [address], [place], [place] Pediatricians/Family Doctors [facility]): [place]. [place], [address], [place] [facility]: [address] [place], [place] [facility]: [place], [place] [facility]): [phone] [facility]: [address] [place]: [address], [place] [facility]: [phone], [phone]  Home Blood Pressure Monitoring for Patients   Your provider has recommended that you check your  blood pressure (BP) at least once a week at home. If you do not have a blood pressure cuff at home, one will be provided for you. Contact your provider if you have not received your monitor within 1 week.   Helpful Tips for Accurate Home Blood Pressure Checks  Don't smoke, exercise, or drink caffeine 30 minutes before checking your BP Use the restroom before checking your BP (a full bladder can raise your  pressure) Relax in a comfortable upright chair Feet on the ground Left arm resting comfortably on a flat surface at the level of your heart Legs uncrossed Back supported Sit quietly and don't talk Place the cuff on your bare arm Adjust snuggly, so that only two fingertips can fit between your skin and the top of the cuff Check 2 readings separated by at least one minute Keep a log of your BP readings For a visual, please reference this diagram: http://ccnc.care/bpdiagram  Provider Name: [facility]     Phone: [phone]  Zone 1: ALL CLEAR  Continue to monitor your symptoms:  BP reading is less than 140 (top number) or less than 90 (bottom number)  No right upper stomach pain No headaches or seeing spots No feeling nauseated or throwing up No swelling in face and hands  Zone 2: CAUTION Call your doctor's office for any of the following:  BP reading is greater than 140 (top number) or greater than 90 (bottom number)  Stomach pain under your ribs in the middle or right side Headaches or seeing spots Feeling nauseated or throwing up Swelling in face and hands  Zone 3: EMERGENCY  Seek immediate medical care if you have any of the following:  BP reading is greater than160 (top number) or greater than 110 (bottom number) Severe headaches not improving with Tylenol Serious difficulty catching your breath Any worsening symptoms from Zone 2   Third Trimester of Pregnancy The third trimester is from week 29 through week 42, months 7 through 9. The third trimester is a time when the fetus is growing rapidly. At the end of the ninth month, the fetus is about 20 inches in length and weighs 6-10 pounds.  BODY CHANGES Your body goes through many changes during pregnancy. The changes vary from woman to woman.  Your weight will continue to increase. You can expect to gain 25-35 pounds (11-16 kg) by the end of the pregnancy. You may begin to get stretch marks on your hips, abdomen,  and breasts. You may urinate more often because the fetus is moving lower into your pelvis and pressing on your bladder. You may develop or continue to have heartburn as a result of your pregnancy. You may develop constipation because certain hormones are causing the muscles that push waste through your intestines to slow down. You may develop hemorrhoids or swollen, bulging veins (varicose veins). You may have pelvic pain because of the weight gain and pregnancy hormones relaxing your joints between the bones in your pelvis. Backaches may result from overexertion of the muscles supporting your posture. You may have changes in your hair. These can include thickening of your hair, rapid growth, and changes in texture. Some women also have hair loss during or after pregnancy, or hair that feels dry or thin. Your hair will most likely return to normal after your baby is born. Your breasts will continue to grow and be tender. A yellow discharge may leak from your breasts called colostrum. Your belly button may stick out. You may   feel short of breath because of your expanding uterus. You may notice the fetus "dropping," or moving lower in your abdomen. You may have a bloody mucus discharge. This usually occurs a few days to a week before labor begins. Your cervix becomes thin and soft (effaced) near your due date. WHAT TO EXPECT AT YOUR PRENATAL EXAMS  You will have prenatal exams every 2 weeks until week 36. Then, you will have weekly prenatal exams. During a routine prenatal visit: You will be weighed to make sure you and the fetus are growing normally. Your blood pressure is taken. Your abdomen will be measured to track your baby's growth. The fetal heartbeat will be listened to. Any test results from the previous visit will be discussed. You may have a cervical check near your due date to see if you have effaced. At around 36 weeks, your caregiver will check your cervix. At the same time, your  caregiver will also perform a test on the secretions of the vaginal tissue. This test is to determine if a type of bacteria, Group B streptococcus, is present. Your caregiver will explain this further. Your caregiver may ask you: What your birth plan is. How you are feeling. If you are feeling the baby move. If you have had any abnormal symptoms, such as leaking fluid, bleeding, severe headaches, or abdominal cramping. If you have any questions. Other tests or screenings that may be performed during your third trimester include: Blood tests that check for low iron levels (anemia). Fetal testing to check the health, activity level, and growth of the fetus. Testing is done if you have certain medical conditions or if there are problems during the pregnancy. FALSE LABOR You may feel small, irregular contractions that eventually go away. These are called Braxton Hicks contractions, or false labor. Contractions may last for hours, days, or even weeks before true labor sets in. If contractions come at regular intervals, intensify, or become painful, it is best to be seen by your caregiver.  SIGNS OF LABOR  Menstrual-like cramps. Contractions that are 5 minutes apart or less. Contractions that start on the top of the uterus and spread down to the lower abdomen and back. A sense of increased pelvic pressure or back pain. A watery or bloody mucus discharge that comes from the vagina. If you have any of these signs before the 37th week of pregnancy, call your caregiver right away. You need to go to the hospital to get checked immediately. HOME CARE INSTRUCTIONS  Avoid all smoking, herbs, alcohol, and unprescribed drugs. These chemicals affect the formation and growth of the baby. Follow your caregiver's instructions regarding medicine use. There are medicines that are either safe or unsafe to take during pregnancy. Exercise only as directed by your caregiver. Experiencing uterine cramps is a good sign to  stop exercising. Continue to eat regular, healthy meals. Wear a good support bra for breast tenderness. Do not use hot tubs, steam rooms, or saunas. Wear your seat belt at all times when driving. Avoid raw meat, uncooked cheese, cat litter boxes, and soil used by cats. These carry germs that can cause birth defects in the baby. Take your prenatal vitamins. Try taking a stool softener (if your caregiver approves) if you develop constipation. Eat more high-fiber foods, such as fresh vegetables or fruit and whole grains. Drink plenty of fluids to keep your urine clear or pale yellow. Take warm sitz baths to soothe any pain or discomfort caused by hemorrhoids. Use hemorrhoid cream if   your caregiver approves. If you develop varicose veins, wear support hose. Elevate your feet for 15 minutes, 3-4 times a day. Limit salt in your diet. Avoid heavy lifting, wear low heal shoes, and practice good posture. Rest a lot with your legs elevated if you have leg cramps or low back pain. Visit your dentist if you have not gone during your pregnancy. Use a soft toothbrush to brush your teeth and be gentle when you floss. A sexual relationship may be continued unless your caregiver directs you otherwise. Do not travel far distances unless it is absolutely necessary and only with the approval of your caregiver. Take prenatal classes to understand, practice, and ask questions about the labor and delivery. Make a trial run to the hospital. Pack your hospital bag. Prepare the baby's nursery. Continue to go to all your prenatal visits as directed by your caregiver. SEEK MEDICAL CARE IF: You are unsure if you are in labor or if your water has broken. You have dizziness. You have mild pelvic cramps, pelvic pressure, or nagging pain in your abdominal area. You have persistent nausea, vomiting, or diarrhea. You have a bad smelling vaginal discharge. You have pain with urination. SEEK IMMEDIATE MEDICAL CARE IF:  You  have a fever. You are leaking fluid from your vagina. You have spotting or bleeding from your vagina. You have severe abdominal cramping or pain. You have rapid weight loss or gain. You have shortness of breath with chest pain. You notice sudden or extreme swelling of your face, hands, ankles, feet, or legs. You have not felt your baby move in over an hour. You have severe headaches that do not go away with medicine. You have vision changes. Document Released: 10/17/2001 Document Revised: 10/28/2013 Document Reviewed: 12/24/2012 ExitCare Patient Information 2015 [employer]. This information is not intended to replace advice given to you by your health care provider. Make sure you discuss any questions you have with your health care provider.       

## 2022-08-15 LAB — CBC
Hematocrit: 34.2 % (ref 34.0–46.6)
Hemoglobin: 11.6 g/dL (ref 11.1–15.9)
MCH: 31.1 pg (ref 26.6–33.0)
MCHC: 33.9 g/dL (ref 31.5–35.7)
MCV: 92 fL (ref 79–97)
Platelets: 246 10*3/uL (ref 150–450)
RBC: 3.73 x10E6/uL — ABNORMAL LOW (ref 3.77–5.28)
RDW: 12.5 % (ref 11.7–15.4)
WBC: 6.9 10*3/uL (ref 3.4–10.8)

## 2022-08-15 LAB — GLUCOSE TOLERANCE, 2 HOURS W/ 1HR
Glucose, 1 hour: 163 mg/dL (ref 70–179)
Glucose, 2 hour: 122 mg/dL (ref 70–152)
Glucose, Fasting: 90 mg/dL (ref 70–91)

## 2022-08-15 LAB — HIV ANTIBODY (ROUTINE TESTING W REFLEX): HIV Screen 4th Generation wRfx: NONREACTIVE

## 2022-08-15 LAB — ANTIBODY SCREEN: Antibody Screen: NEGATIVE

## 2022-08-15 LAB — RPR: RPR Ser Ql: NONREACTIVE

## 2022-09-04 ENCOUNTER — Ambulatory Visit (INDEPENDENT_AMBULATORY_CARE_PROVIDER_SITE_OTHER): Payer: Medicaid Other | Admitting: Women's Health

## 2022-09-04 ENCOUNTER — Encounter: Payer: Self-pay | Admitting: Women's Health

## 2022-09-04 VITALS — BP 107/66 | HR 104 | Wt 168.0 lb

## 2022-09-04 DIAGNOSIS — Z3483 Encounter for supervision of other normal pregnancy, third trimester: Secondary | ICD-10-CM

## 2022-09-04 DIAGNOSIS — Z3A30 30 weeks gestation of pregnancy: Secondary | ICD-10-CM

## 2022-09-04 NOTE — Patient Instructions (Signed)
Michele Bates, thank you for choosing our office today! We appreciate the opportunity to meet your healthcare needs. You may receive a short survey by mail, e-mail, or through [employer]. If you are happy with your care we would appreciate if you could take just a few minutes to complete the survey questions. We read all of your comments and take your feedback very seriously. Thank you again for choosing our office.  Center for [employer] Team at [place] at [facility] ([place], [place] [address]) Entrance C, located off of [address] parking   CLASSES: Go to [email or web address] to register for classes (childbirth, breastfeeding, waterbirth, infant CPR, daddy bootcamp, etc.)  Call the office [phone]) or go to [facility] if: You begin to have strong, frequent contractions Your water breaks.  Sometimes it is a big gush of fluid, sometimes it is just a trickle that keeps getting your panties wet or running down your legs You have vaginal bleeding.  It is normal to have a small amount of spotting if your cervix was checked.  You don't feel your baby moving like normal.  If you don't, get you something to eat and drink and lay down and focus on feeling your baby move.   If your baby is still not moving like normal, you should call the office or go to [facility].  Call the office [phone]) or go to [facility] hospital for these signs of pre-eclampsia: Severe headache that does not go away with Tylenol Visual changes- seeing spots, double, blurred vision Pain under your right breast or upper abdomen that does not go away with Tums or heartburn medicine Nausea and/or vomiting Severe swelling in your hands, feet, and face   Tdap Vaccine It is recommended that you get the Tdap vaccine during the third trimester of EACH pregnancy to help protect your baby from getting pertussis (whooping cough) 27-36 weeks is the BEST time to do  this so that you can pass the protection on to your baby. During pregnancy is better than after pregnancy, but if you are unable to get it during pregnancy it will be offered at the hospital.  You can get this vaccine with [place], at the health department, your family doctor, or some local pharmacies Everyone who will be around your baby should also be up-to-date on their vaccines before the baby comes. Adults (who are not pregnant) only need 1 dose of Tdap during adulthood.   [facility] Pediatricians/Family Doctors [facility]): [address] [place], [place] [facility]: [address] [place], [phone]                [facility]): [place], [phone] (call to ask if accepting patients) [facility] Department: [address], [place], [place] Pediatricians/Family Doctors [facility]): [place]. [place], [address], [place] [facility]: [address] [place], [place] [facility]: [place], [place] [facility]): [phone] [facility]: [address] [place]: [address] [place] [facility]: [phone], [phone]  Home Blood Pressure Monitoring for Patients   Your provider has recommended that you check your  blood pressure (BP) at least once a week at home. If you do not have a blood pressure cuff at home, one will be provided for you. Contact your provider if you have not received your monitor within 1 week.   Helpful Tips for Accurate Home Blood Pressure Checks  Don't smoke, exercise, or drink caffeine 30 minutes before checking your BP Use the restroom before checking your BP (a full bladder can raise your  pressure) Relax in a comfortable upright chair Feet on the ground Left arm resting comfortably on a flat surface at the level of your heart Legs uncrossed Back supported Sit quietly and don't talk Place the cuff on your bare arm Adjust snuggly, so that only two fingertips can fit between your skin and the top of the cuff Check 2 readings separated by at least one minute Keep a log of your BP readings For a visual, please reference this diagram: http://ccnc.care/bpdiagram  Provider Name: [facility]     Phone: [phone]  Zone 1: ALL CLEAR  Continue to monitor your symptoms:  BP reading is less than 140 (top number) or less than 90 (bottom number)  No right upper stomach pain No headaches or seeing spots No feeling nauseated or throwing up No swelling in face and hands  Zone 2: CAUTION Call your doctor's office for any of the following:  BP reading is greater than 140 (top number) or greater than 90 (bottom number)  Stomach pain under your ribs in the middle or right side Headaches or seeing spots Feeling nauseated or throwing up Swelling in face and hands  Zone 3: EMERGENCY  Seek immediate medical care if you have any of the following:  BP reading is greater than160 (top number) or greater than 110 (bottom number) Severe headaches not improving with Tylenol Serious difficulty catching your breath Any worsening symptoms from Zone 2  Preterm Labor and Birth Information  The normal length of a pregnancy is 39-41 weeks. Preterm labor is when labor starts before 37 completed weeks of pregnancy. What are the risk factors for preterm labor? Preterm labor is more likely to occur in women who: Have certain infections during pregnancy such as a bladder infection, sexually transmitted infection, or infection inside the uterus (chorioamnionitis). Have a shorter-than-normal cervix. Have gone into preterm labor before. Have had surgery on their cervix. Are younger than age 17  or older than age 35. Are African American. Are pregnant with twins or multiple babies (multiple gestation). Take street drugs or smoke while pregnant. Do not gain enough weight while pregnant. Became pregnant shortly after having been pregnant. What are the symptoms of preterm labor? Symptoms of preterm labor include: Cramps similar to those that can happen during a menstrual period. The cramps may happen with diarrhea. Pain in the abdomen or lower back. Regular uterine contractions that may feel like tightening of the abdomen. A feeling of increased pressure in the pelvis. Increased watery or bloody mucus discharge from the vagina. Water breaking (ruptured amniotic sac). Why is it important to recognize signs of preterm labor? It is important to recognize signs of preterm labor because babies who are born prematurely may not be fully developed. This can put them at an increased risk for: Long-term (chronic) heart and lung problems. Difficulty immediately after birth with regulating body systems, including blood sugar, body temperature, heart rate, and breathing rate. Bleeding in the brain. Cerebral palsy. Learning difficulties. Death. These risks are highest for babies who are born before 34 weeks   of pregnancy. How is preterm labor treated? Treatment depends on the length of your pregnancy, your condition, and the health of your baby. It may involve: Having a stitch (suture) placed in your cervix to prevent your cervix from opening too early (cerclage). Taking or being given medicines, such as: Hormone medicines. These may be given early in pregnancy to help support the pregnancy. Medicine to stop contractions. Medicines to help mature the baby's lungs. These may be prescribed if the risk of delivery is high. Medicines to prevent your baby from developing cerebral palsy. If the labor happens before 34 weeks of pregnancy, you may need to stay in the hospital. What should I do if I  think I am in preterm labor? If you think that you are going into preterm labor, call your health care provider right away. How can I prevent preterm labor in future pregnancies? To increase your chance of having a full-term pregnancy: Do not use any tobacco products, such as cigarettes, chewing tobacco, and e-cigarettes. If you need help quitting, ask your health care provider. Do not use street drugs or medicines that have not been prescribed to you during your pregnancy. Talk with your health care provider before taking any herbal supplements, even if you have been taking them regularly. Make sure you gain a healthy amount of weight during your pregnancy. Watch for infection. If you think that you might have an infection, get it checked right away. Make sure to tell your health care provider if you have gone into preterm labor before. This information is not intended to replace advice given to you by your health care provider. Make sure you discuss any questions you have with your health care provider. Document Revised: 02/14/2019 Document Reviewed: 03/15/2016 Elsevier Patient Education  2020 [employer]vier Inc.   

## 2022-09-04 NOTE — Progress Notes (Signed)
LOW-RISK PREGNANCY VISIT Patient name: Michele Bates MRN [id number]  Date of birth: [birth year] Chief Complaint:   Routine Prenatal Visit  History of Present Illness:   Michele Bates is a 31 y.o. [id number] female at [redacted]w[redacted]d with an Estimated Date of Delivery: 11/09/22 being seen today for ongoing management of a low-risk pregnancy.   Today she reports no complaints. Contractions: Not present. Vag. Bleeding: None.  Movement: Present. denies leaking of fluid.     08/14/2022    9:38 AM 05/15/2022    2:47 PM 10/15/2020    9:59 AM 06/25/2020    9:13 AM 09/11/2018    3:26 PM  Depression screen PHQ 2/9  Decreased Interest 0 0 0 0 0  Down, Depressed, Hopeless 0 0 0 0 0  PHQ - 2 Score 0 0 0 0 0  Altered sleeping 0 0 0 0 0  Tired, decreased energy 0 0 0 0 1  Change in appetite 0 0 0 0 0  Feeling bad or failure about yourself  0 0 0 0 0  Trouble concentrating 0 0 0 0 0  Moving slowly or fidgety/restless 0 0 0 0 0  Suicidal thoughts 0 0 0 0 0  PHQ-9 Score 0 0 0 0 1        08/14/2022    9:39 AM 05/15/2022    2:47 PM 10/15/2020   10:00 AM 06/25/2020    9:13 AM  GAD 7 : Generalized Anxiety Score  Nervous, Anxious, on Edge 0 0 0 0  Control/stop worrying 0 0 0 0  Worry too much - different things 0 0 0 0  Trouble relaxing 0 0 0 0  Restless 0 0 0 0  Easily annoyed or irritable 0 0 0 0  Afraid - awful might happen 0 0 0 0  Total GAD 7 Score 0 0 0 0      Review of Systems:   Pertinent items are noted in HPI Denies abnormal vaginal discharge w/ itching/odor/irritation, headaches, visual changes, shortness of breath, chest pain, abdominal pain, severe nausea/vomiting, or problems with urination or bowel movements unless otherwise stated above. Pertinent History Reviewed:  Reviewed past medical,surgical, social, obstetrical and family history.  Reviewed problem list, medications and allergies. Physical Assessment:   Vitals:   09/04/22 1009  BP: 107/66  Pulse: (!) 104  Weight: 168  lb (76.2 kg)  Body mass index is 30.73 kg/m.        Physical Examination:   General appearance: Well appearing, and in no distress  Mental status: Alert, oriented to person, place, and time  Skin: Warm & dry  Cardiovascular: Normal heart rate noted  Respiratory: Normal respiratory effort, no distress  Abdomen: Soft, gravid, nontender  Pelvic: Cervical exam deferred         Extremities: Edema: None  Fetal Status: Fetal Heart Rate (bpm): 153 Fundal Height: 30 cm Movement: Present    Chaperone: N/A   No results found for this or any previous visit (from the past 24 hour(s)).  Assessment & Plan:  1) Low-risk pregnancy [id number] at [redacted]w[redacted]d with an Estimated Date of Delivery: 11/09/22   2) H/O gHTN, ASA   Meds: No orders of the defined types were placed in this encounter.  Labs/procedures today: none  Plan:  Continue routine obstetrical care  Next visit: prefers in person    Reviewed: Preterm labor symptoms and general obstetric precautions including but not limited to vaginal bleeding, contractions, leaking of fluid and fetal movement  were reviewed in detail with the patient.  All questions were answered. Does have home bp cuff. Office bp cuff given: not applicable. Check bp weekly, let us know if consistently >140 and/or >90.  Follow-up: Return in about 2 weeks (around 09/18/2022) for [place], in person.  No future appointments.  No orders of the defined types were placed in this encounter.  Roma Schanz CNM, [facility] 09/04/2022 10:26 AM

## 2022-09-18 ENCOUNTER — Ambulatory Visit (INDEPENDENT_AMBULATORY_CARE_PROVIDER_SITE_OTHER): Payer: Medicaid Other | Admitting: Advanced Practice Midwife

## 2022-09-18 VITALS — BP 103/65 | HR 98 | Wt 172.0 lb

## 2022-09-18 DIAGNOSIS — Z3483 Encounter for supervision of other normal pregnancy, third trimester: Secondary | ICD-10-CM

## 2022-09-18 DIAGNOSIS — Z3A32 32 weeks gestation of pregnancy: Secondary | ICD-10-CM

## 2022-09-18 NOTE — Patient Instructions (Signed)
Michele Bates, I greatly value your feedback.  If you receive a survey following your visit with [place] today, we appreciate you taking the time to fill it out.  Thanks, Michele Beams, DNP, CNM  [facility] HAS MOVED!!! It is now [facility] ([address] [place], [place] [address]) Entrance located off of [address] Free 24/7 valet parking   Go to [email or web address] to register for FREE online childbirth classes    Call the office [phone]) or go to [facility] if: You begin to have strong, frequent contractions Your water breaks.  Sometimes it is a big gush of fluid, sometimes it is just a trickle that keeps getting your panties wet or running down your legs You have vaginal bleeding.  It is normal to have a small amount of spotting if your cervix was checked.  You don't feel your baby moving like normal.  If you don't, get you something to eat and drink and lay down and focus on feeling your baby move.  You should feel at least 10 movements in 2 hours.  If you don't, you should call the office or go to [facility].   Home Blood Pressure Monitoring for Patients   Your provider has recommended that you check your blood pressure (BP) at least once a week at home. If you do not have a blood pressure cuff at home, one will be provided for you. Contact your provider if you have not received your monitor within 1 week.   Helpful Tips for Accurate Home Blood Pressure Checks  Don't smoke, exercise, or drink caffeine 30 minutes before checking your BP Use the restroom before checking your BP (a full bladder can raise your pressure) Relax in a comfortable upright chair Feet on the ground Left arm resting comfortably on a flat surface at the level of your heart Legs uncrossed Back supported Sit quietly and don't talk Place the cuff on your bare arm Adjust snuggly, so that only two fingertips can fit between your skin and the  top of the cuff Check 2 readings separated by at least one minute Keep a log of your BP readings For a visual, please reference this diagram: http://ccnc.care/bpdiagram  Provider Name: [facility]     Phone: [phone]  Zone 1: ALL CLEAR  Continue to monitor your symptoms:  BP reading is less than 140 (top number) or less than 90 (bottom number)  No right upper stomach pain No headaches or seeing spots No feeling nauseated or throwing up No swelling in face and hands  Zone 2: CAUTION Call your doctor's office for any of the following:  BP reading is greater than 140 (top number) or greater than 90 (bottom number)  Stomach pain under your ribs in the middle or right side Headaches or seeing spots Feeling nauseated or throwing up Swelling in face and hands  Zone 3: EMERGENCY  Seek immediate medical care if you have any of the following:  BP reading is greater than160 (top number) or greater than 110 (bottom number) Severe headaches not improving with Tylenol Serious difficulty catching your breath Any worsening symptoms from Zone 2

## 2022-09-18 NOTE — Progress Notes (Signed)
   LOW-RISK PREGNANCY VISIT Patient name: Michele Bates MRN [id number]  Date of birth: [birth year] Chief Complaint:   Routine Prenatal Visit  History of Present Illness:   Michele Bates is a 31 y.o. [phone] female at [redacted]w[redacted]d with an Estimated Date of Delivery: 11/09/22 being seen today for ongoing management of a low-risk pregnancy.  Today she reports no complaints. Contractions: Not present. Vag. Bleeding: None.  Movement: Present. denies leaking of fluid. Review of Systems:   Pertinent items are noted in HPI Denies abnormal vaginal discharge w/ itching/odor/irritation, headaches, visual changes, shortness of breath, chest pain, abdominal pain, severe nausea/vomiting, or problems with urination or bowel movements unless otherwise stated above. Pertinent History Reviewed:  Reviewed past medical,surgical, social, obstetrical and family history.  Reviewed problem list, medications and allergies. Physical Assessment:   Vitals:   09/18/22 0854  BP: 103/65  Pulse: 98  Weight: 172 lb (78 kg)  Body mass index is 31.46 kg/m.        Physical Examination:   General appearance: Well appearing, and in no distress  Mental status: Alert, oriented to person, place, and time  Skin: Warm & dry  Cardiovascular: Normal heart rate noted  Respiratory: Normal respiratory effort, no distress  Abdomen: Soft, gravid, nontender  Pelvic: Cervical exam deferred         Extremities: Edema: None  Fetal Status:     Movement: Present    Chaperone: n/a    No results found for this or any previous visit (from the past 24 hour(s)).  Assessment & Plan:  1) Low-risk pregnancy [id number] at [redacted]w[redacted]d with an Estimated Date of Delivery: 11/09/22     Meds: No orders of the defined types were placed in this encounter.  Labs/procedures today:   Plan:  Continue routine obstetrical care  Next visit: prefers in person    Reviewed: Preterm labor symptoms and general obstetric precautions including but not limited to  vaginal bleeding, contractions, leaking of fluid and fetal movement were reviewed in detail with the patient.  All questions were answered. Has home bp cuff.. Check bp weekly, let us know if >140/90.   Follow-up: Return in about 2 weeks (around 10/02/2022) for LROB.  No future appointments.  No orders of the defined types were placed in this encounter.  Jacklyn Shell DNP, CNM 09/18/2022 9:14 AM

## 2022-10-02 ENCOUNTER — Ambulatory Visit (INDEPENDENT_AMBULATORY_CARE_PROVIDER_SITE_OTHER): Payer: Medicaid Other | Admitting: Advanced Practice Midwife

## 2022-10-02 VITALS — BP 110/69 | HR 98 | Wt 176.0 lb

## 2022-10-02 DIAGNOSIS — Z3A34 34 weeks gestation of pregnancy: Secondary | ICD-10-CM

## 2022-10-02 DIAGNOSIS — Z3483 Encounter for supervision of other normal pregnancy, third trimester: Secondary | ICD-10-CM

## 2022-10-02 NOTE — Progress Notes (Signed)
   LOW-RISK PREGNANCY VISIT Patient name: MARJA ADDERLEY MRN [id number]  Date of birth: 1991/03/08 Chief Complaint:   Routine Prenatal Visit  History of Present Illness:   Michele Bates is a 31 y.o. [id number] female at [redacted]w[redacted]d with an Estimated Date of Delivery: 11/09/22 being seen today for ongoing management of a low-risk pregnancy.  Today she reports normal pregnancy complaints. Contractions: Not present. Vag. Bleeding: None.  Movement: Present. denies leaking of fluid. Review of Systems:   Pertinent items are noted in HPI Denies abnormal vaginal discharge w/ itching/odor/irritation, headaches, visual changes, shortness of breath, chest pain, abdominal pain, severe nausea/vomiting, or problems with urination or bowel movements unless otherwise stated above. Pertinent History Reviewed:  Reviewed past medical,surgical, social, obstetrical and family history.  Reviewed problem list, medications and allergies. Physical Assessment:   Vitals:   10/02/22 1540  BP: 110/69  Pulse: 98  Weight: 176 lb (79.8 kg)  Body mass index is 32.19 kg/m.        Physical Examination:   General appearance: Well appearing, and in no distress  Mental status: Alert, oriented to person, place, and time  Skin: Warm & dry  Cardiovascular: Normal heart rate noted  Respiratory: Normal respiratory effort, no distress  Abdomen: Soft, gravid, nontender  Pelvic: Cervical exam deferred         Extremities: Edema: Trace  Fetal Status: Fetal Heart Rate (bpm): 142 Fundal Height: 35 cm Movement: Present    Chaperone: n/a    No results found for this or any previous visit (from the past 24 hour(s)).  Assessment & Plan:  1) Low-risk pregnancy [id number] at [redacted]w[redacted]d with an Estimated Date of Delivery: 11/09/22     Meds: No orders of the defined types were placed in this encounter.  Labs/procedures today:   Plan:  Continue routine obstetrical care  Next visit: prefers in person    Reviewed: Preterm labor symptoms  and general obstetric precautions including but not limited to vaginal bleeding, contractions, leaking of fluid and fetal movement were reviewed in detail with the patient.  All questions were answered. Has home bp cuff.. Check bp weekly, let us know if >140/90.   Follow-up: Return in about 2 weeks (around 10/16/2022) for LROB.  No future appointments.  No orders of the defined types were placed in this encounter.  Jacklyn Shell DNP, CNM 10/02/2022 4:09 PM

## 2022-10-02 NOTE — Patient Instructions (Signed)
Bernetha R Franson, I greatly value your feedback.  If you receive a survey following your visit with us today, we appreciate you taking the time to fill it out.  Thanks, Fran Cresenzo-Dishmon, DNP, CNM  [facility] It is now [facility] ([address] , [place] [address]) Entrance located off of [address] Free 24/7 valet parking   Go to [email or web address] to register for FREE online childbirth classes    Call the office [phone]) or go to [facility] if: You begin to have strong, frequent contractions Your water breaks.  Sometimes it is a big gush of fluid, sometimes it is just a trickle that keeps getting your panties wet or running down your legs You have vaginal bleeding.  It is normal to have a small amount of spotting if your cervix was checked.  You don't feel your baby moving like normal.  If you don't, get you something to eat and drink and lay down and focus on feeling your baby move.  You should feel at least 10 movements in 2 hours.  If you don't, you should call the office or go to [facility].   Home Blood Pressure Monitoring for Patients   Your provider has recommended that you check your blood pressure (BP) at least once a week at home. If you do not have a blood pressure cuff at home, one will be provided for you. Contact your provider if you have not received your monitor within 1 week.   Helpful Tips for Accurate Home Blood Pressure Checks  Don't smoke, exercise, or drink caffeine 30 minutes before checking your BP Use the restroom before checking your BP (a full bladder can raise your pressure) Relax in a comfortable upright chair Feet on the ground Left arm resting comfortably on a flat surface at the level of your heart Legs uncrossed Back supported Sit quietly and don't talk Place the cuff on your bare arm Adjust snuggly, so that only two fingertips can fit between your skin and the  top of the cuff Check 2 readings separated by at least one minute Keep a log of your BP readings For a visual, please reference this diagram: http://ccnc.care/bpdiagram  Provider Name: [facility]     Phone: [phone]  Zone 1: ALL CLEAR  Continue to monitor your symptoms:  BP reading is less than 140 (top number) or less than 90 (bottom number)  No right upper stomach pain No headaches or seeing spots No feeling nauseated or throwing up No swelling in face and hands  Zone 2: CAUTION Call your doctor's office for any of the following:  BP reading is greater than 140 (top number) or greater than 90 (bottom number)  Stomach pain under your ribs in the middle or right side Headaches or seeing spots Feeling nauseated or throwing up Swelling in face and hands  Zone 3: EMERGENCY  Seek immediate medical care if you have any of the following:  BP reading is greater than160 (top number) or greater than 110 (bottom number) Severe headaches not improving with Tylenol Serious difficulty catching your breath Any worsening symptoms from Zone 2   

## 2022-10-16 ENCOUNTER — Encounter: Payer: Medicaid Other | Admitting: Advanced Practice Midwife

## 2022-10-19 ENCOUNTER — Encounter: Payer: Self-pay | Admitting: Advanced Practice Midwife

## 2022-10-19 ENCOUNTER — Other Ambulatory Visit (HOSPITAL_COMMUNITY)
Admission: RE | Admit: 2022-10-19 | Discharge: 2022-10-19 | Disposition: A | Discharge status: Home / Self Care | Payer: Medicaid Other | Source: Ambulatory Visit | Attending: Advanced Practice Midwife | Admitting: Advanced Practice Midwife

## 2022-10-19 ENCOUNTER — Ambulatory Visit (INDEPENDENT_AMBULATORY_CARE_PROVIDER_SITE_OTHER): Payer: Medicaid Other | Admitting: Advanced Practice Midwife

## 2022-10-19 VITALS — BP 113/78 | HR 108 | Wt 179.5 lb

## 2022-10-19 DIAGNOSIS — Z3A37 37 weeks gestation of pregnancy: Secondary | ICD-10-CM | POA: Insufficient documentation

## 2022-10-19 DIAGNOSIS — Z3480 Encounter for supervision of other normal pregnancy, unspecified trimester: Secondary | ICD-10-CM | POA: Insufficient documentation

## 2022-10-19 DIAGNOSIS — Z1332 Encounter for screening for maternal depression: Secondary | ICD-10-CM | POA: Diagnosis not present

## 2022-10-19 NOTE — Progress Notes (Signed)
   LOW-RISK PREGNANCY VISIT Patient name: Michele Bates MRN [id number]  Date of birth: 01-12-1991 Chief Complaint:   Routine Prenatal Visit (GBS, GC/CHL; Leaking fluid)  History of Present Illness:   Michele Bates is a 31 y.o. [phone] female at [redacted]w[redacted]d with an Estimated Date of Delivery: 11/09/22 being seen today for ongoing management of a low-risk pregnancy.  Today she reports no complaints. Had some fluid come out at [facility] 2 days ago, none since, feels like it was urine. Contractions: Irritability. Vag. Bleeding: None.  Movement: Present. denies leaking of fluid. Review of Systems:   Pertinent items are noted in HPI Denies abnormal vaginal discharge w/ itching/odor/irritation, headaches, visual changes, shortness of breath, chest pain, abdominal pain, severe nausea/vomiting, or problems with urination or bowel movements unless otherwise stated above. Pertinent History Reviewed:  Reviewed past medical,surgical, social, obstetrical and family history.  Reviewed problem list, medications and allergies. Physical Assessment:   Vitals:   10/19/22 1035  BP: 113/78  Pulse: (!) 108  Weight: 179 lb 8 oz (81.4 kg)  Body mass index is 32.83 kg/m.        Physical Examination:   General appearance: Well appearing, and in no distress  Mental status: Alert, oriented to person, place, and time  Skin: Warm & dry  Cardiovascular: Normal heart rate noted  Respiratory: Normal respiratory effort, no distress  Abdomen: Soft, gravid, nontender  Pelvic: Cervical exam performed  Dilation: 2 Effacement (%): Thick Station: -2  Extremities:    Fetal Status: Fetal Heart Rate (bpm): 148 Fundal Height: 38 cm Movement: Present Presentation: Vertex  Chaperone:  Malachy Mood    No results found for this or any previous visit (from the past 24 hour(s)).  Assessment & Plan:  1) Low-risk pregnancy [id number] at [redacted]w[redacted]d with an Estimated Date of Delivery: 11/09/22      Meds: No orders of the defined types  were placed in this encounter.  Labs/procedures today: GBS, GC, CHL  Plan:  Continue routine obstetrical care  Next visit: prefers in person    Reviewed: Term labor symptoms and general obstetric precautions including but not limited to vaginal bleeding, contractions, leaking of fluid and fetal movement were reviewed in detail with the patient.  All questions were answered. Has home bp cuff. Check bp weekly, let us know if >140/90.   Follow-up: Return for weekly LRONB X3.  No future appointments.  Orders Placed This Encounter  Procedures   Strep Gp B NAA   Jacklyn Shell DNP, CNM 10/19/2022 11:13 AM

## 2022-10-19 NOTE — Patient Instructions (Signed)

## 2022-10-20 LAB — CERVICOVAGINAL ANCILLARY ONLY
Chlamydia: NEGATIVE
Comment: NEGATIVE
Comment: NORMAL
Neisseria Gonorrhea: NEGATIVE

## 2022-10-21 ENCOUNTER — Inpatient Hospital Stay (HOSPITAL_COMMUNITY)
Admission: AD | Admit: 2022-10-21 | Discharge: 2022-10-21 | Disposition: A | Discharge status: Home / Self Care | Payer: Medicaid Other | Attending: Obstetrics & Gynecology | Admitting: Obstetrics & Gynecology

## 2022-10-21 ENCOUNTER — Encounter (HOSPITAL_COMMUNITY): Payer: Self-pay | Admitting: Obstetrics & Gynecology

## 2022-10-21 DIAGNOSIS — Z0371 Encounter for suspected problem with amniotic cavity and membrane ruled out: Secondary | ICD-10-CM | POA: Diagnosis not present

## 2022-10-21 DIAGNOSIS — Z3A37 37 weeks gestation of pregnancy: Secondary | ICD-10-CM | POA: Diagnosis not present

## 2022-10-21 LAB — POCT FERN TEST: POCT Fern Test: NEGATIVE

## 2022-10-21 LAB — STREP GP B NAA: Strep Gp B NAA: NEGATIVE

## 2022-10-21 NOTE — MAU Provider Note (Signed)
Event Date/Time   First Provider Initiated Contact with Patient 10/21/22 1516       S: Ms. Michele Bates is a 31 y.o. [phone] at [redacted]w[redacted]d  who presents to MAU today complaining of leaking of fluid since this morning. She denies vaginal bleeding. She denies contractions. She reports normal fetal movement.    O: BP 109/73   Pulse (!) 102   Temp 98.1 F (36.7 C)   Resp 18   Ht 5\' 2"  (1.575 m)   Wt 80.7 kg   LMP 01/13/2022   SpO2 100%   BMI 32.56 kg/m  GENERAL: Well-developed, well-nourished female in no acute distress.  HEAD: Normocephalic, atraumatic.  CHEST: Normal effort of breathing, regular heart rate ABDOMEN: Soft, nontender, gravid PELVIC: Normal external female genitalia. Vagina is pink and rugated. Cervix with normal contour, no lesions. Normal discharge.  No pooling.   Cervical exam:   deferred   Fetal Monitoring: Baseline: 130 Variability: moderate Accelerations: 15x15 Decelerations: no Contractions: Q 10-12 minutes  Results for orders placed or performed during the hospital encounter of 10/21/22 (from the past 24 hour(s))  Fern Test     Status: Normal   Collection Time: 10/21/22  3:27 PM  Result Value Ref Range   POCT Fern Test Negative = intact amniotic membranes      A: SIUP at [redacted]w[redacted]d  Membranes intact  P: Discharge home Labor precautions & kick counts Keep f/u with OB  [redacted]w[redacted]d, NP 10/21/2022 3:41 PM

## 2022-10-21 NOTE — MAU Note (Signed)
.  Michele Bates is a 31 y.o. at [redacted]w[redacted]d here in MAU reporting: pelvic pain and pressure . Feeling like she is leaking clear fluid since 4am. Also c/o having some chills. Stated she was 2 cm at [facility] appointment this week. Good fetal movement reported.   Onset of complaint: 4am Pain score: 8 Vitals:   10/21/22 1434  BP: 125/72  Pulse: (!) 107  Resp: 18  Temp: 98.1 F (36.7 C)     FHT:121 Lab orders placed from triage:

## 2022-10-25 ENCOUNTER — Encounter (HOSPITAL_COMMUNITY): Payer: Self-pay | Admitting: Obstetrics and Gynecology

## 2022-10-25 ENCOUNTER — Other Ambulatory Visit: Payer: Self-pay

## 2022-10-25 ENCOUNTER — Ambulatory Visit (INDEPENDENT_AMBULATORY_CARE_PROVIDER_SITE_OTHER): Payer: Medicaid Other | Admitting: Obstetrics & Gynecology

## 2022-10-25 ENCOUNTER — Inpatient Hospital Stay (HOSPITAL_COMMUNITY)
Admission: AD | Admit: 2022-10-25 | Discharge: 2022-10-25 | Disposition: A | Discharge status: Home / Self Care | Payer: Medicaid Other | Attending: Obstetrics and Gynecology | Admitting: Obstetrics and Gynecology

## 2022-10-25 ENCOUNTER — Encounter: Payer: Self-pay | Admitting: Obstetrics & Gynecology

## 2022-10-25 VITALS — BP 129/76 | HR 97 | Wt 178.0 lb

## 2022-10-25 DIAGNOSIS — Z3483 Encounter for supervision of other normal pregnancy, third trimester: Secondary | ICD-10-CM | POA: Diagnosis not present

## 2022-10-25 DIAGNOSIS — Z3A37 37 weeks gestation of pregnancy: Secondary | ICD-10-CM

## 2022-10-25 DIAGNOSIS — O471 False labor at or after 37 completed weeks of gestation: Secondary | ICD-10-CM | POA: Insufficient documentation

## 2022-10-25 DIAGNOSIS — O368130 Decreased fetal movements, third trimester, not applicable or unspecified: Secondary | ICD-10-CM | POA: Diagnosis present

## 2022-10-25 DIAGNOSIS — Z3689 Encounter for other specified antenatal screening: Secondary | ICD-10-CM

## 2022-10-25 DIAGNOSIS — O479 False labor, unspecified: Secondary | ICD-10-CM | POA: Diagnosis not present

## 2022-10-25 DIAGNOSIS — Z3480 Encounter for supervision of other normal pregnancy, unspecified trimester: Secondary | ICD-10-CM

## 2022-10-25 NOTE — Progress Notes (Signed)
   LOW-RISK PREGNANCY VISIT Patient name: Michele Bates MRN [id number]  Date of birth: Mar 15, 1991 Chief Complaint:   Routine Prenatal Visit  History of Present Illness:   Michele Bates is a 31 y.o. [id number] female at [redacted]w[redacted]d with an Estimated Date of Delivery: 11/09/22 being seen today for ongoing management of a low-risk pregnancy.      10/19/2022   10:41 AM 08/14/2022    9:38 AM 05/15/2022    2:47 PM 10/15/2020    9:59 AM 06/25/2020    9:13 AM  Depression screen PHQ 2/9  Decreased Interest 0 0 0 0 0  Down, Depressed, Hopeless 0 0 0 0 0  PHQ - 2 Score 0 0 0 0 0  Altered sleeping 0 0 0 0 0  Tired, decreased energy 1 0 0 0 0  Change in appetite 0 0 0 0 0  Feeling bad or failure about yourself  0 0 0 0 0  Trouble concentrating 0 0 0 0 0  Moving slowly or fidgety/restless 0 0 0 0 0  Suicidal thoughts 0 0 0 0 0  PHQ-9 Score 1 0 0 0 0    Today she reports  pelvic pressure and contractions . Contractions: Irregular. Vag. Bleeding: None.  Movement: Present. denies leaking of fluid. Review of Systems:   Pertinent items are noted in HPI Denies abnormal vaginal discharge w/ itching/odor/irritation, headaches, visual changes, shortness of breath, chest pain, abdominal pain, severe nausea/vomiting, or problems with urination or bowel movements unless otherwise stated above. Pertinent History Reviewed:  Reviewed past medical,surgical, social, obstetrical and family history.  Reviewed problem list, medications and allergies.  Physical Assessment:   Vitals:   10/25/22 1318  BP: 129/76  Pulse: 97  Weight: 178 lb (80.7 kg)  Body mass index is 32.56 kg/m.        Physical Examination:   General appearance: Well appearing, and in no distress  Mental status: Alert, oriented to person, place, and time  Skin: Warm & dry  Respiratory: Normal respiratory effort, no distress  Abdomen: Soft, gravid, nontender  Pelvic: Cervical exam performed  Dilation: 2 Effacement (%): 50 Station:  -3  Extremities: Edema: Trace  Psych:  mood and affect appropriate  Fetal Status: Fetal Heart Rate (bpm): 130 Fundal Height: 38 cm Movement: Present    Chaperone:  pt declined     No results found for this or any previous visit (from the past 24 hour(s)).   Assessment & Plan:  1) Low-risk pregnancy [id number] at [redacted]w[redacted]d with an Estimated Date of Delivery: 11/09/22   -expectant management   Meds: No orders of the defined types were placed in this encounter.  Labs/procedures today: none  Plan:  Continue routine obstetrical care  Next visit: prefers in person    Reviewed: Term labor symptoms and general obstetric precautions including but not limited to vaginal bleeding, contractions, leaking of fluid and fetal movement were reviewed in detail with the patient.  All questions were answered.   Follow-up: Return in about 1 week (around 11/01/2022) for LROB visit.  No orders of the defined types were placed in this encounter.   Myna Hidalgo, DO Attending Obstetrician & Gynecologist, [facility] for [employer], [facility]

## 2022-10-25 NOTE — MAU Provider Note (Signed)
Event Date/Time  First Provider Initiated Contact with Patient 10/25/22 1851     S: [name] is a 31 y.o. [id number] at [redacted]w[redacted]d  who presents to MAU today complaining of contractions since 0400 today. She denies vaginal bleeding. She denies LOF. She reported decreased fetal movement to MAU registration but endorses fetal movement during actual MAU encounter.  O: BP 137/78 (BP Location: Right Arm)   Pulse (!) 107   Temp 98.7 F (37.1 C) (Oral)   Resp 16   Wt 79.4 kg   LMP 01/13/2022   SpO2 100%   BMI 32.01 kg/m  GENERAL: Well-developed, well-nourished female in no acute distress.  HEAD: Normocephalic, atraumatic.  CHEST: Normal effort of breathing, regular heart rate ABDOMEN: Soft, nontender, gravid  Cervical exam:  Dilation: 2 Effacement (%): 50 Station: Ballotable Presentation: Vertex Exam by:: [employer],. RN   Fetal Monitoring: Baseline: 130 Variability: Mod Accelerations: 15 x 15 Decelerations: None Contractions: q 4-6 min   A: SIUP at [redacted]w[redacted]d  CNM at bedside for MSE due to report of DFM, now endorsing very active movement Reactive tracing Cervix remains 2/thick/ballotable 90 min after initial exam  P: Discharge home in stable condition  Clayton Bibles, MSA, MSN, [facility] 10/25/2022 8:33 PM

## 2022-10-25 NOTE — MAU Note (Signed)
.  Michele Bates is a 31 y.o. at [redacted]w[redacted]d here in MAU reporting: ctx since 0400 this morning. States they became worse after cervical exam in office this morning. Now every 5 minutes. Denies VB or LOF. DFM today.  Pain score: 7

## 2022-10-25 NOTE — Discharge Instructions (Signed)

## 2022-10-28 ENCOUNTER — Other Ambulatory Visit: Payer: Self-pay

## 2022-10-28 ENCOUNTER — Inpatient Hospital Stay
Admission: EM | Admit: 2022-10-28 | Discharge: 2022-10-29 | DRG: 769 | Disposition: A | Discharge status: Home / Self Care | Payer: Medicaid Other | Attending: Certified Nurse Midwife | Admitting: Certified Nurse Midwife

## 2022-10-28 ENCOUNTER — Encounter: Payer: Self-pay | Admitting: Obstetrics and Gynecology

## 2022-10-28 DIAGNOSIS — Z302 Encounter for sterilization: Secondary | ICD-10-CM

## 2022-10-28 DIAGNOSIS — O99215 Obesity complicating the puerperium: Secondary | ICD-10-CM | POA: Diagnosis not present

## 2022-10-28 DIAGNOSIS — O9081 Anemia of the puerperium: Principal | ICD-10-CM | POA: Diagnosis present

## 2022-10-28 DIAGNOSIS — D62 Acute posthemorrhagic anemia: Secondary | ICD-10-CM | POA: Diagnosis not present

## 2022-10-28 DIAGNOSIS — O479 False labor, unspecified: Secondary | ICD-10-CM | POA: Diagnosis not present

## 2022-10-28 LAB — TYPE AND SCREEN
ABO/RH(D): O POS
Antibody Screen: NEGATIVE

## 2022-10-28 LAB — CBC
HCT: 34.9 % — ABNORMAL LOW (ref 36.0–46.0)
Hemoglobin: 11.6 g/dL — ABNORMAL LOW (ref 12.0–15.0)
MCH: 29.3 pg (ref 26.0–34.0)
MCHC: 33.2 g/dL (ref 30.0–36.0)
MCV: 88.1 fL (ref 80.0–100.0)
Platelets: 201 10*3/uL (ref 150–400)
RBC: 3.96 MIL/uL (ref 3.87–5.11)
RDW: 14.3 % (ref 11.5–15.5)
WBC: 6.8 10*3/uL (ref 4.0–10.5)
nRBC: 0 % (ref 0.0–0.2)

## 2022-10-28 MED ORDER — LACTATED RINGERS IV SOLN: 500.0000 mL | INTRAVENOUS | Status: DC | PRN

## 2022-10-28 MED ORDER — BENZOCAINE-MENTHOL 20-0.5 % EX AERO
1.0000 | INHALATION_SPRAY | CUTANEOUS | Status: DC | PRN
  Administered 2022-10-28: 1 via TOPICAL
  Filled 2022-10-28: qty 56

## 2022-10-28 MED ORDER — COCONUT OIL OIL: 1.0000 | TOPICAL_OIL | Status: DC | PRN

## 2022-10-28 MED ORDER — LIDOCAINE HCL (PF) 1 % IJ SOLN: 30.0000 mL | INTRAMUSCULAR | Status: DC | PRN

## 2022-10-28 MED ORDER — ZOLPIDEM TARTRATE 5 MG PO TABS: 5.0000 mg | ORAL_TABLET | Freq: Every evening | ORAL | Status: DC | PRN

## 2022-10-28 MED ORDER — ONDANSETRON HCL 4 MG/2ML IJ SOLN: 4.0000 mg | Freq: Four times a day (QID) | INTRAMUSCULAR | Status: DC | PRN

## 2022-10-28 MED ORDER — PRENATAL MULTIVITAMIN CH
1.0000 | ORAL_TABLET | Freq: Every day | ORAL | Status: DC
  Administered 2022-10-28 – 2022-10-29 (×2): 1 via ORAL
  Filled 2022-10-28 (×2): qty 1

## 2022-10-28 MED ORDER — SENNOSIDES-DOCUSATE SODIUM 8.6-50 MG PO TABS: 2.0000 | ORAL_TABLET | Freq: Every day | ORAL | Status: DC

## 2022-10-28 MED ORDER — SOD CITRATE-CITRIC ACID 500-334 MG/5ML PO SOLN: 30.0000 mL | ORAL | Status: DC | PRN

## 2022-10-28 MED ORDER — TETANUS-DIPHTH-ACELL PERTUSSIS 5-2.5-18.5 LF-MCG/0.5 IM SUSY: 0.5000 mL | PREFILLED_SYRINGE | Freq: Once | INTRAMUSCULAR | Status: DC

## 2022-10-28 MED ORDER — WITCH HAZEL-GLYCERIN EX PADS
1.0000 | MEDICATED_PAD | CUTANEOUS | Status: DC | PRN
  Administered 2022-10-28: 1 via TOPICAL
  Filled 2022-10-28: qty 100

## 2022-10-28 MED ORDER — IBUPROFEN 600 MG PO TABS
ORAL_TABLET | ORAL | Status: AC
  Administered 2022-10-28: 600 mg via ORAL
  Filled 2022-10-28: qty 1

## 2022-10-28 MED ORDER — IBUPROFEN 600 MG PO TABS
600.0000 mg | ORAL_TABLET | Freq: Four times a day (QID) | ORAL | Status: DC
  Administered 2022-10-28 – 2022-10-29 (×3): 600 mg via ORAL
  Filled 2022-10-28 (×4): qty 1

## 2022-10-28 MED ORDER — LACTATED RINGERS IV SOLN: INTRAVENOUS | Status: DC

## 2022-10-28 MED ORDER — ACETAMINOPHEN 325 MG PO TABS: 650.0000 mg | ORAL_TABLET | ORAL | Status: DC | PRN

## 2022-10-28 MED ORDER — ONDANSETRON HCL 4 MG/2ML IJ SOLN: 4.0000 mg | INTRAMUSCULAR | Status: DC | PRN

## 2022-10-28 MED ORDER — ONDANSETRON HCL 4 MG PO TABS: 4.0000 mg | ORAL_TABLET | ORAL | Status: DC | PRN

## 2022-10-28 MED ORDER — SIMETHICONE 80 MG PO CHEW
80.0000 mg | CHEWABLE_TABLET | ORAL | Status: DC | PRN
  Administered 2022-10-29: 80 mg via ORAL
  Filled 2022-10-28: qty 1

## 2022-10-28 MED ORDER — DIPHENHYDRAMINE HCL 25 MG PO CAPS: 25.0000 mg | ORAL_CAPSULE | Freq: Four times a day (QID) | ORAL | Status: DC | PRN

## 2022-10-28 MED ORDER — OXYTOCIN BOLUS FROM INFUSION
333.0000 mL | Freq: Once | INTRAVENOUS | Status: DC
  Administered 2022-10-28: 333 mL via INTRAVENOUS

## 2022-10-28 MED ORDER — OXYTOCIN-SODIUM CHLORIDE 30-0.9 UT/500ML-% IV SOLN
2.5000 [IU]/h | INTRAVENOUS | Status: DC
  Administered 2022-10-28: 2.5 [IU]/h via INTRAVENOUS

## 2022-10-28 MED ORDER — DIBUCAINE (PERIANAL) 1 % EX OINT
1.0000 | TOPICAL_OINTMENT | CUTANEOUS | Status: DC | PRN
  Administered 2022-10-28: 1 via RECTAL
  Filled 2022-10-28: qty 28

## 2022-10-28 NOTE — H&P (Signed)
OB History & Physical   History of Present Illness:  Chief Complaint:   HPI:  Michele Bates is a 31 y.o. L3Y1017 female at [redacted]w[redacted]d dated by [place].  She presents to L&D by EMS having given birth at home around 0720. She reports contractions started around midnight and she has been leaking fluid throughout the morning, then delivered suddenly with EMS. Placenta delivered in the ER. Bleeding currently moderate with clots, uterus firm with massage.    Pregnancy Issues: 1. Unplanned out-of-hospital delivery 2. History of gHTN, taking aspirin   Maternal Medical History:   Past Medical History:  Diagnosis Date   Chronic pelvic pain in female    Gestational hypertension     Past Surgical History:  Procedure Laterality Date   TUMOR REMOVAL     head    No Known Allergies  Prior to Admission medications   Medication Sig Start Date End Date Taking? Authorizing Provider  aspirin EC 81 MG tablet Take 2 tablets (162 mg total) by mouth daily. Swallow whole. 05/15/22  Yes Cheral Marker, CNM  Prenatal Vit-Fe Fumarate-FA (PRENATAL VITAMIN PO) Take 1 tablet by mouth daily.   Yes [provider]  Calcium Carbonate Antacid (TUMS PO) Take by mouth.    [provider]    Prenatal care site: [facility]  Social History: She  reports that she has never smoked. She has never used smokeless tobacco. She reports that she does not drink alcohol and does not use drugs.  Family History: family history includes Diabetes in her father, maternal grandfather, and maternal grandmother; Heart disease in her maternal grandfather and paternal grandfather; Kidney disease in her maternal grandmother.   Review of Systems: A full review of systems was performed and negative except as noted in the HPI.     Physical Exam:  Vital Signs: LMP 01/13/2022   Breastfeeding Unknown  General: no acute distress.  HEENT: normocephalic, atraumatic Heart: regular rate & rhythm.  No  murmurs/rubs/gallops Lungs: clear to auscultation bilaterally, normal respiratory effort Abdomen: soft, non-tender;  Pelvic: Uterus firm with massage, clots expressed  Perineum intact  External: Normal external female genitalia   Extremities: non-tender, symmetric, mild edema bilaterally.  DTRs: +2  Neurologic: Alert & oriented x 3.    No results found for this or any previous visit (from the past 24 hour(s)).  Pertinent Results:  Prenatal Labs: Blood type/Rh O pos  Antibody screen neg  Rubella Immune  Varicella unknown  RPR NR  HBsAg Neg  HIV NR  GC neg  Chlamydia neg  Genetic screening negative  2 hour GTT 90, 163, 122  3 hour GTT N/a  GBS negative   No results found.  Assessment:  Michele Bates is a 31 y.o. [phone] female at [redacted]w[redacted]d with unplanned out-of-hospital birth.   Plan:  1. Admit to Labor & Delivery; consents reviewed and obtained - Dr. Jean Rosenthal notified of admission  2. Routine OB: - Prenatal labs reviewed, as above - Rh pos, no need for Rhogam - CBC, T&S, RPR on admit - Regular diet  3. Post Partum Planning: - Uterus firm with massage, pitocin bolus administered and bleeding scant. QBL unknown since delivered at home.  - Placenta intact - Perineum intact - Infant feeding: breast and bottle - Contraception: BTL consent signed with [facility]  Janyce Llanos, CNM 10/28/22 8:30 AM

## 2022-10-28 NOTE — Lactation Note (Signed)
This note was copied from a baby's chart. Lactation Consultation Note  Patient Name: Michele Bates Today's Date: 10/28/2022 Reason for consult: Initial assessment;Early term 37-38.6wks Age:30 hours  Maternal Data Has patient been taught Hand Expression?: Yes Does the patient have breastfeeding experience prior to this delivery?: Yes How long did the patient breastfeed?: 2 yrs  Feeding Mother's Current Feeding Choice: Breast Milk Attempted latch due to no attempt at 3 hrs, baby would latch, suck a few times and fall asleep, mom to attempt again in 30 min -1hr, First feed after delivery was great, mom reports lots of swallows LATCH Score Latch: Grasps breast easily, tongue down, lips flanged, rhythmical sucking.  Audible Swallowing: None (very sleepy, only few sucks)  Type of Nipple: Everted at rest and after stimulation  Comfort (Breast/Nipple): Soft / non-tender  Hold (Positioning): No assistance needed to correctly position infant at breast.  LATCH Score: 8   Lactation Tools Discussed/Used  LC name and no written on white board   Interventions Interventions: Breast feeding basics reviewed;Assisted with latch;Skin to skin;Hand express;Support pillows;Education  Discharge Pump: Declined WIC Program: Yes  Consult Status Consult Status: PRN    Dyann Kief 10/28/2022, 3:16 PM

## 2022-10-29 ENCOUNTER — Inpatient Hospital Stay: Payer: Medicaid Other | Admitting: Anesthesiology

## 2022-10-29 ENCOUNTER — Other Ambulatory Visit: Payer: Self-pay

## 2022-10-29 ENCOUNTER — Encounter
Admission: EM | Disposition: A | Discharge status: Home / Self Care | Payer: Self-pay | Source: Home / Self Care | Attending: Certified Nurse Midwife

## 2022-10-29 DIAGNOSIS — E669 Obesity, unspecified: Secondary | ICD-10-CM | POA: Diagnosis not present

## 2022-10-29 DIAGNOSIS — Z302 Encounter for sterilization: Secondary | ICD-10-CM

## 2022-10-29 DIAGNOSIS — O9081 Anemia of the puerperium: Secondary | ICD-10-CM | POA: Diagnosis not present

## 2022-10-29 DIAGNOSIS — Z6832 Body mass index (BMI) 32.0-32.9, adult: Secondary | ICD-10-CM | POA: Diagnosis not present

## 2022-10-29 HISTORY — PX: TUBAL LIGATION: SHX77

## 2022-10-29 LAB — CBC
HCT: 31.6 % — ABNORMAL LOW (ref 36.0–46.0)
Hemoglobin: 10.4 g/dL — ABNORMAL LOW (ref 12.0–15.0)
MCH: 29.4 pg (ref 26.0–34.0)
MCHC: 32.9 g/dL (ref 30.0–36.0)
MCV: 89.3 fL (ref 80.0–100.0)
Platelets: 191 10*3/uL (ref 150–400)
RBC: 3.54 MIL/uL — ABNORMAL LOW (ref 3.87–5.11)
RDW: 14.4 % (ref 11.5–15.5)
WBC: 8.7 10*3/uL (ref 4.0–10.5)
nRBC: 0 % (ref 0.0–0.2)

## 2022-10-29 LAB — RPR: RPR Ser Ql: NONREACTIVE

## 2022-10-29 SURGERY — LIGATION, FALLOPIAN TUBE, POSTPARTUM
Anesthesia: General | Site: Abdomen

## 2022-10-29 MED ORDER — 0.9 % SODIUM CHLORIDE (POUR BTL) OPTIME
TOPICAL | Status: DC | PRN
  Administered 2022-10-29: 500 mL

## 2022-10-29 MED ORDER — OXYCODONE HCL 5 MG PO TABS: 5.0000 mg | ORAL_TABLET | Freq: Four times a day (QID) | ORAL | 0 refills | Status: AC | PRN

## 2022-10-29 MED ORDER — LACTATED RINGERS IV SOLN: INTRAVENOUS | Status: DC | PRN

## 2022-10-29 MED ORDER — POVIDONE-IODINE 10 % EX SWAB: 2.0000 | Freq: Once | CUTANEOUS | Status: DC

## 2022-10-29 MED ORDER — KETOROLAC TROMETHAMINE 30 MG/ML IJ SOLN
30.0000 mg | INTRAMUSCULAR | Status: AC
  Administered 2022-10-29: 30 mg via INTRAVENOUS

## 2022-10-29 MED ORDER — ONDANSETRON HCL 4 MG/2ML IJ SOLN
INTRAMUSCULAR | Status: AC
  Filled 2022-10-29: qty 2

## 2022-10-29 MED ORDER — KETOROLAC TROMETHAMINE 30 MG/ML IJ SOLN
INTRAMUSCULAR | Status: AC
  Filled 2022-10-29: qty 1

## 2022-10-29 MED ORDER — SUCCINYLCHOLINE CHLORIDE 200 MG/10ML IV SOSY
PREFILLED_SYRINGE | INTRAVENOUS | Status: DC | PRN
  Administered 2022-10-29: 80 mg via INTRAVENOUS

## 2022-10-29 MED ORDER — DEXAMETHASONE SODIUM PHOSPHATE 10 MG/ML IJ SOLN
INTRAMUSCULAR | Status: AC
  Filled 2022-10-29: qty 1

## 2022-10-29 MED ORDER — PHENYLEPHRINE 80 MCG/ML (10ML) SYRINGE FOR IV PUSH (FOR BLOOD PRESSURE SUPPORT)
PREFILLED_SYRINGE | INTRAVENOUS | Status: AC
  Filled 2022-10-29: qty 10

## 2022-10-29 MED ORDER — LACTATED RINGERS IV SOLN: INTRAVENOUS | Status: DC

## 2022-10-29 MED ORDER — OXYCODONE HCL 5 MG PO TABS: 5.0000 mg | ORAL_TABLET | Freq: Once | ORAL | Status: DC | PRN

## 2022-10-29 MED ORDER — FENTANYL CITRATE (PF) 100 MCG/2ML IJ SOLN
INTRAMUSCULAR | Status: AC
  Filled 2022-10-29: qty 2

## 2022-10-29 MED ORDER — SUCCINYLCHOLINE CHLORIDE 200 MG/10ML IV SOSY
PREFILLED_SYRINGE | INTRAVENOUS | Status: AC
  Filled 2022-10-29: qty 10

## 2022-10-29 MED ORDER — PHENYLEPHRINE HCL (PRESSORS) 10 MG/ML IV SOLN
INTRAVENOUS | Status: DC | PRN
  Administered 2022-10-29: 50 ug via INTRAVENOUS

## 2022-10-29 MED ORDER — DEXAMETHASONE SODIUM PHOSPHATE 10 MG/ML IJ SOLN
INTRAMUSCULAR | Status: DC | PRN
  Administered 2022-10-29: 10 mg via INTRAVENOUS

## 2022-10-29 MED ORDER — ROCURONIUM BROMIDE 10 MG/ML (PF) SYRINGE
PREFILLED_SYRINGE | INTRAVENOUS | Status: AC
  Filled 2022-10-29: qty 10

## 2022-10-29 MED ORDER — ONDANSETRON HCL 4 MG/2ML IJ SOLN
INTRAMUSCULAR | Status: DC | PRN
  Administered 2022-10-29: 4 mg via INTRAVENOUS

## 2022-10-29 MED ORDER — ROCURONIUM BROMIDE 100 MG/10ML IV SOLN
INTRAVENOUS | Status: DC | PRN
  Administered 2022-10-29: 5 mg via INTRAVENOUS
  Administered 2022-10-29: 30 mg via INTRAVENOUS

## 2022-10-29 MED ORDER — ONDANSETRON HCL 4 MG/2ML IJ SOLN: 4.0000 mg | Freq: Once | INTRAMUSCULAR | Status: DC | PRN

## 2022-10-29 MED ORDER — LIDOCAINE HCL (PF) 2 % IJ SOLN
INTRAMUSCULAR | Status: AC
  Filled 2022-10-29: qty 5

## 2022-10-29 MED ORDER — OXYCODONE HCL 5 MG/5ML PO SOLN: 5.0000 mg | Freq: Once | ORAL | Status: DC | PRN

## 2022-10-29 MED ORDER — ACETAMINOPHEN 10 MG/ML IV SOLN
INTRAVENOUS | Status: AC
  Filled 2022-10-29: qty 100

## 2022-10-29 MED ORDER — BUPIVACAINE HCL (PF) 0.5 % IJ SOLN
INTRAMUSCULAR | Status: DC | PRN
  Administered 2022-10-29: 2 mL

## 2022-10-29 MED ORDER — OXYCODONE HCL 5 MG PO TABS: 5.0000 mg | ORAL_TABLET | Freq: Four times a day (QID) | ORAL | Status: DC | PRN

## 2022-10-29 MED ORDER — IBUPROFEN 600 MG PO TABS: 600.0000 mg | ORAL_TABLET | Freq: Four times a day (QID) | ORAL | Status: DC | PRN

## 2022-10-29 MED ORDER — SUGAMMADEX SODIUM 200 MG/2ML IV SOLN
INTRAVENOUS | Status: DC | PRN
  Administered 2022-10-29: 200 mg via INTRAVENOUS
  Administered 2022-10-29: 100 mg via INTRAVENOUS

## 2022-10-29 MED ORDER — PROPOFOL 10 MG/ML IV BOLUS
INTRAVENOUS | Status: AC
  Filled 2022-10-29: qty 20

## 2022-10-29 MED ORDER — FENTANYL CITRATE (PF) 100 MCG/2ML IJ SOLN: 25.0000 ug | INTRAMUSCULAR | Status: DC | PRN

## 2022-10-29 MED ORDER — HYDROCODONE-ACETAMINOPHEN 5-325 MG PO TABS
1.0000 | ORAL_TABLET | Freq: Four times a day (QID) | ORAL | Status: DC | PRN
  Administered 2022-10-29: 1 via ORAL
  Filled 2022-10-29: qty 1

## 2022-10-29 MED ORDER — ACETAMINOPHEN 10 MG/ML IV SOLN
1000.0000 mg | Freq: Once | INTRAVENOUS | Status: DC | PRN
  Administered 2022-10-29: 1000 mg via INTRAVENOUS

## 2022-10-29 MED ORDER — LIDOCAINE HCL (CARDIAC) PF 100 MG/5ML IV SOSY
PREFILLED_SYRINGE | INTRAVENOUS | Status: DC | PRN
  Administered 2022-10-29: 80 mg via INTRAVENOUS

## 2022-10-29 MED ORDER — KETOROLAC TROMETHAMINE 15 MG/ML IJ SOLN: 30.0000 mg | INTRAMUSCULAR | Status: DC

## 2022-10-29 MED ORDER — ACETAMINOPHEN 325 MG PO TABS: 650.0000 mg | ORAL_TABLET | Freq: Four times a day (QID) | ORAL | Status: DC | PRN

## 2022-10-29 MED ORDER — PROPOFOL 10 MG/ML IV BOLUS
INTRAVENOUS | Status: DC | PRN
  Administered 2022-10-29: 150 mg via INTRAVENOUS

## 2022-10-29 MED ORDER — MENTHOL 3 MG MT LOZG: 1.0000 | LOZENGE | OROMUCOSAL | Status: DC | PRN

## 2022-10-29 MED ORDER — FENTANYL CITRATE (PF) 100 MCG/2ML IJ SOLN
INTRAMUSCULAR | Status: DC | PRN
  Administered 2022-10-29 (×2): 50 ug via INTRAVENOUS

## 2022-10-29 SURGICAL SUPPLY — 40 items
ADH SKN CLS APL DERMABOND .7 (GAUZE/BANDAGES/DRESSINGS) ×1
ADH SKN CLS LQ APL DERMABOND (GAUZE/BANDAGES/DRESSINGS) ×1
APL PRP STRL LF DISP 70% ISPRP (MISCELLANEOUS) ×1
BLADE SURG SZ11 CARB STEEL (BLADE) ×1 IMPLANT
CHLORAPREP W/TINT 26 (MISCELLANEOUS) ×1 IMPLANT
DERMABOND ADVANCED .7 DNX12 (GAUZE/BANDAGES/DRESSINGS) ×1 IMPLANT
DERMABOND ADVANCED .7 DNX6 (GAUZE/BANDAGES/DRESSINGS) IMPLANT
DRAPE LAPAROTOMY 77X122 PED (DRAPES) ×1 IMPLANT
ELECT CAUTERY BLADE 6.4 (BLADE) ×1 IMPLANT
ELECT REM PT RETURN 9FT ADLT (ELECTROSURGICAL) ×1
ELECTRODE REM PT RTRN 9FT ADLT (ELECTROSURGICAL) ×1 IMPLANT
GAUZE 4X4 16PLY ~~LOC~~+RFID DBL (SPONGE) ×1 IMPLANT
GLOVE BIO SURGEON STRL SZ7 (GLOVE) ×1 IMPLANT
GLOVE SURG UNDER POLY LF SZ7.5 (GLOVE) ×1 IMPLANT
GOWN STRL REUS W/ TWL LRG LVL3 (GOWN DISPOSABLE) ×2 IMPLANT
GOWN STRL REUS W/ TWL XL LVL3 (GOWN DISPOSABLE) ×1 IMPLANT
GOWN STRL REUS W/TWL LRG LVL3 (GOWN DISPOSABLE) ×2
GOWN STRL REUS W/TWL XL LVL3 (GOWN DISPOSABLE) ×1
KIT TURNOVER CYSTO (KITS) ×1 IMPLANT
LABEL OR SOLS (LABEL) ×1 IMPLANT
MANIFOLD NEPTUNE II (INSTRUMENTS) ×1 IMPLANT
NEEDLE HYPO 22GX1.5 SAFETY (NEEDLE) ×1 IMPLANT
NS IRRIG 500ML POUR BTL (IV SOLUTION) ×1 IMPLANT
PACK BASIN MINOR ARMC (MISCELLANEOUS) ×1 IMPLANT
RETRACTOR RING XSMALL (MISCELLANEOUS) IMPLANT
RETRACTOR WOUND ALXS 18CM SML (MISCELLANEOUS) IMPLANT
RTRCTR WOUND ALEXIS 13CM XS SH (MISCELLANEOUS)
RTRCTR WOUND ALEXIS O 18CM SML (MISCELLANEOUS)
SCRUB CHG 4% DYNA-HEX 4OZ (MISCELLANEOUS) ×1 IMPLANT
SPONGE T-LAP 18X18 ~~LOC~~+RFID (SPONGE) IMPLANT
SUT MNCRL 4-0 (SUTURE) ×1
SUT MNCRL 4-0 27XMFL (SUTURE) ×1
SUT PLAIN GUT 0 (SUTURE) ×2 IMPLANT
SUT VIC AB 2-0 UR6 27 (SUTURE) ×1 IMPLANT
SUT VIC AB 3-0 SH 27 (SUTURE) ×1
SUT VIC AB 3-0 SH 27X BRD (SUTURE) ×1 IMPLANT
SUTURE MNCRL 4-0 27XMF (SUTURE) ×1 IMPLANT
SYR 10ML LL (SYRINGE) ×1 IMPLANT
TRAP FLUID SMOKE EVACUATOR (MISCELLANEOUS) ×1 IMPLANT
WATER STERILE IRR 500ML POUR (IV SOLUTION) ×1 IMPLANT

## 2022-10-29 NOTE — Progress Notes (Signed)
D/C to Home. Pt. Demonstrates and v/o of self care and all D/C instructions.

## 2022-10-29 NOTE — Transfer of Care (Signed)
Immediate Anesthesia Transfer of Care Note  Patient: Michele Bates  Procedure(s) Performed: POST PARTUM TUBAL LIGATION (Abdomen)  Patient Location: PACU  Anesthesia Type:General  Level of Consciousness: awake  Airway & Oxygen Therapy: Patient Spontanous Breathing  Post-op Assessment: Report given to RN  Post vital signs: stable  Last Vitals:  Vitals Value Taken Time  BP 108/68 10/29/22 0919  Temp    Pulse 88 10/29/22 0926  Resp 17 10/29/22 0926  SpO2 95 % 10/29/22 0926  Vitals shown include unvalidated device data.  Last Pain:  Vitals:   10/29/22 0734  TempSrc:   PainSc: 0-No pain         Complications: No notable events documented.

## 2022-10-29 NOTE — Discharge Instructions (Addendum)
Keep your next scheduled follow up appointment.  If you have questions or concerns please reach out to your on call provider.Call for Temp. Greater than 100.4  No Tampons, Douches or Sex for 6 weeks. No tub bath for 6 weeks. Go to E.R. For any Shortness of Breath, chest Pain or saturating one or more pads in one hour and for any clots larger than a small lemon. Feelings of depression that make you feel unable to take care of yourself and/or baby. Do not lift anything heavier than your baby. No driving for 2 weeks and/or while you are taking Narcotic medication.

## 2022-10-29 NOTE — Progress Notes (Signed)
Post Partum Day 1  Subjective: Doing well after BTL, min pain.  Doing well, no concerns. Ambulating without difficulty, pain managed with PO meds, tolerating regular diet, and voiding without difficulty.   No fever/chills, chest pain, shortness of breath, nausea/vomiting, or leg pain. No nipple or breast pain. No headache, visual changes, or RUQ/epigastric pain.  Objective: BP 107/73 (BP Location: Left Arm)   Pulse 69   Temp 98.7 F (37.1 C) (Oral)   Resp 14   Ht 5\' 2"  (1.575 m)   Wt 79.4 kg   LMP 01/13/2022   SpO2 99%   Breastfeeding Unknown   BMI 32.01 kg/m    Physical Exam:  General: alert and cooperative Breasts: soft/nontender CV: RRR Pulm: nl effort Abdomen: soft, non-tender Uterine Fundus: firm Incision: healing well Perineum: minimal edema, intact Lochia: appropriate DVT Evaluation: No evidence of DVT seen on physical exam. Edinburgh:     10/28/2022    4:12 PM 02/02/2021    8:46 AM 12/25/2020    7:56 AM 04/28/2019   10:45 AM 03/23/2019    7:27 PM  Edinburgh Postnatal Depression Scale Screening Tool  I have been able to laugh and see the funny side of things. 0 0 0 0 0  I have looked forward with enjoyment to things. 0 0 0 0 0  I have blamed myself unnecessarily when things went wrong. 0 0 0 0 0  I have been anxious or worried for no good reason. 0 0 0 0 0  I have felt scared or panicky for no good reason. 0 0 0 0 0  Things have been getting on top of me. 0 0 0 0 0  I have been so unhappy that I have had difficulty sleeping. 0 0 0 0 0  I have felt sad or miserable. 0 0 0 0 0  I have been so unhappy that I have been crying. 0 0 0 0 0  The thought of harming myself has occurred to me. 0 0 0 0 0  Edinburgh Postnatal Depression Scale Total 0 0 0 0 0     Recent Labs    10/28/22 0901 10/29/22 0400  HGB 11.6* 10.4*  HCT 34.9* 31.6*  WBC 6.8 8.7  PLT 201 191    Assessment/Plan: 31 y.o. 10/31/22 postpartum day # 1  -Continue routine postpartum  care -Lactation consult PRN for breastfeeding   -Acute blood loss anemia - hemodynamically stable and asymptomatic; start PO ferrous sulfate BID with stool softeners    Disposition: Continue inpatient postpartum care. Desires discharge home today   LOS: 1 day   Maisen Klingler, CNM 10/29/2022, 11:37 AM

## 2022-10-29 NOTE — Anesthesia Preprocedure Evaluation (Addendum)
Anesthesia Evaluation  Patient identified by MRN, date of birth, ID band Patient awake    Reviewed: Allergy & Precautions, NPO status , Patient's Chart, lab work & pertinent test results  History of Anesthesia Complications Negative for: history of anesthetic complications  Airway Mallampati: III   Neck ROM: Full    Dental no notable dental hx.    Pulmonary neg pulmonary ROS   Pulmonary exam normal breath sounds clear to auscultation       Cardiovascular Exercise Tolerance: Good negative cardio ROS Normal cardiovascular exam Rhythm:Regular Rate:Normal     Neuro/Psych negative neurological ROS     GI/Hepatic ,GERD (with pregnancy)  ,,  Endo/Other  Obesity   Renal/GU negative Renal ROS     Musculoskeletal   Abdominal   Peds  Hematology negative hematology ROS (+)   Anesthesia Other Findings   Reproductive/Obstetrics                             Anesthesia Physical Anesthesia Plan  ASA: 2  Anesthesia Plan: General   Post-op Pain Management:    Induction: Intravenous  PONV Risk Score and Plan: 3 and Ondansetron, Dexamethasone and Treatment may vary due to age or medical condition  Airway Management Planned: Oral ETT  Additional Equipment:   Intra-op Plan:   Post-operative Plan: Extubation in OR  Informed Consent: I have reviewed the patients History and Physical, chart, labs and discussed the procedure including the risks, benefits and alternatives for the proposed anesthesia with the patient or authorized representative who has indicated his/her understanding and acceptance.     Dental advisory given  Plan Discussed with: CRNA  Anesthesia Plan Comments: (Patient consented for risks of anesthesia including but not limited to:  - adverse reactions to medications - damage to eyes, teeth, lips or other oral mucosa - nerve damage due to positioning  - sore throat or  hoarseness - damage to heart, brain, nerves, lungs, other parts of body or loss of life  Informed patient about role of CRNA in peri- and intra-operative care.  Patient voiced understanding.)       Anesthesia Quick Evaluation

## 2022-10-29 NOTE — Progress Notes (Signed)
Telephone Report to PACU RN.

## 2022-10-29 NOTE — Op Note (Signed)
Op Note Postpartum Tubal Ligation  Pre-Op Diagnosis: multiparity, desires permanent sterilization  Post-Op Diagnosis: multiparity, desires permanent sterilization  Procedures:  Postpartum tubal ligation via Pomeroy method  Primary Surgeon: Thomasene Mohair, MD  EBL: Minimal   IVF: 500 mL   Specimens: portion of right and left fallopian tubes  Drains: None  Complications: None   Disposition: PACU   Condition: Stable   Findings: normal appearing bilateral fallopian tubes  Indication: The patient is a 31 y.o. [id number] who is postpartum day 1 status post spontaneous vaginal delivery.  She has been counseled extensively regarding risks, benefits, and alternatives to tubal ligation, including non-permanent forms of contraception that are equivalent in efficacy with potentially better side effects.  She has been advised that there is a failure rate of 3-5 in every 1,000 tubal ligations per year with an increased risk of ectopic pregnancy should pregnancy occur.   Procedure Summary:  The patient was taken to the operating room where general anesthesia was administered and found to be adequate. After timeout was called a small transverse, infraumbilical incision was made with the scalpel. The incision was carried down through the fascia until the peritoneum was identified and entered. The peritoneum was noted to be free of any adhesions and the incision was then extended.  The patient's right fallopian tube was identified, brought incision, and grasped with a Babcock clamp. The tube was then followed out to the fimbria. The Babcock clamp was then used to grasp the tube approximately 4 cm from the cornual region. A 3 cm segment of tube was then ligated with the 2 free ties of plain gut, and excised. Good hemostasis was noted and the tube was returned to the abdomen. The left fallopian tube was then ligated, and a 3 cm segment excised in a similar fashion. Excellent hemostasis was noted, and the  tube returned to the abdomen.  The peritoneum and fascia were closed in a single layer using 2-0 Vicryl. The skin was closed in a subcuticular fashion using 4-0 monocryl. The closure was also closed with Dermabond.  Sponge, lap, needle, and instrument counts were correct x 2.  VTE prophylaxis: SCDs. Antibiotic prophylaxis: none indicated. The patient tolerated the procedure well and was taken to the PACU in stable condition.   Thomasene Mohair, MD 10/29/2022 8:54 AM

## 2022-10-29 NOTE — Plan of Care (Signed)
Pt. Demonstrates and v/o of all self care.

## 2022-10-29 NOTE — Anesthesia Procedure Notes (Signed)
Procedure Name: Intubation Date/Time: 10/29/2022 8:19 AM  Performed by: Garner Nash, CRNAPre-anesthesia Checklist: Patient identified, Emergency Drugs available, Suction available and Patient being monitored Patient Re-evaluated:Patient Re-evaluated prior to induction Oxygen Delivery Method: Circle system utilized Preoxygenation: Pre-oxygenation with 100% oxygen Induction Type: IV induction Laryngoscope Size: Mac and 3 Grade View: Grade II Tube type: Oral Tube size: 6.5 mm Number of attempts: 1 Placement Confirmation: ETT inserted through vocal cords under direct vision, positive ETCO2 and breath sounds checked- equal and bilateral Secured at: 20 cm Tube secured with: Tape Dental Injury: Teeth and Oropharynx as per pre-operative assessment

## 2022-10-29 NOTE — Anesthesia Postprocedure Evaluation (Signed)
Anesthesia Post Note  Patient: Michele Bates  Procedure(s) Performed: POST PARTUM TUBAL LIGATION (Abdomen)  Patient location during evaluation: PACU Anesthesia Type: General Level of consciousness: awake and alert, oriented and patient cooperative Pain management: pain level controlled Vital Signs Assessment: post-procedure vital signs reviewed and stable Respiratory status: spontaneous breathing, nonlabored ventilation and respiratory function stable Cardiovascular status: blood pressure returned to baseline and stable Postop Assessment: adequate PO intake Anesthetic complications: no   No notable events documented.   Last Vitals:  Vitals:   10/29/22 0940 10/29/22 0945  BP: (!) 102/54 114/80  Pulse: 78 78  Resp: 17 19  Temp:  37 C  SpO2: 100% 99%    Last Pain:  Vitals:   10/29/22 0954  TempSrc:   PainSc: 3                  Reed Breech

## 2022-10-29 NOTE — Discharge Summary (Signed)
Obstetrical Discharge Summary  Patient Name: Michele Bates DOB: 1991/07/01 MRN: [id number]  Date of Admission: 10/28/2022 Date of Delivery: 10/28/22 Delivered by: Delivered at home Date of Discharge: 10/29/2022  [facility] TMA:[id number] last menstrual period was 01/13/2022. EDC Estimated Date of Delivery: 11/09/22 Gestational Age at Delivery: [redacted]w[redacted]d   Antepartum complications:  1. Unplanned out-of-hospital delivery 2. History of gHTN, taking aspirin  Admitting Diagnosis: Encounter for postpartum care after unplanned out of hospital delivery [Z39.2]  Secondary Diagnosis: Patient Active Problem List   Diagnosis Date Noted   Encounter for sterilization 10/29/2022   NSVD (normal spontaneous vaginal delivery) 10/29/2022   Encounter for postpartum care after unplanned out of hospital delivery 10/28/2022   Encounter for supervision of normal pregnancy, antepartum 05/15/2022   History of gestational hypertension 06/25/2020    Discharge Diagnosis: Term Pregnancy Delivered    at home  Augmentation: N/A Complications: None Intrapartum complications/course: see H&P Delivery Type: spontaneous vaginal delivery Anesthesia: none Placenta: spontaneous To Pathology: No  Laceration: none Episiotomy: none Newborn Data: Live born female  Birth Weight: 7 lb 9 oz (3430 g) APGAR: not assigned  Newborn Delivery   Birth date/time: 10/28/2022 07:26:00 Delivery type: Vaginal, Spontaneous      Postpartum Procedures: none Edinburgh:     10/28/2022    4:12 PM 02/02/2021    8:46 AM 12/25/2020    7:56 AM 04/28/2019   10:45 AM 03/23/2019    7:27 PM  Edinburgh Postnatal Depression Scale Screening Tool  I have been able to laugh and see the funny side of things. 0 0 0 0 0  I have looked forward with enjoyment to things. 0 0 0 0 0  I have blamed myself unnecessarily when things went wrong. 0 0 0 0 0  I have been anxious or worried for no good reason. 0 0 0 0 0  I have  felt scared or panicky for no good reason. 0 0 0 0 0  Things have been getting on top of me. 0 0 0 0 0  I have been so unhappy that I have had difficulty sleeping. 0 0 0 0 0  I have felt sad or miserable. 0 0 0 0 0  I have been so unhappy that I have been crying. 0 0 0 0 0  The thought of harming myself has occurred to me. 0 0 0 0 0  Edinburgh Postnatal Depression Scale Total 0 0 0 0 0     Post partum course:  Patient had an uncomplicated postpartum course.  By time of discharge on PPD#1, her pain was controlled on oral pain medications; she had appropriate lochia and was ambulating, voiding without difficulty and tolerating regular diet.  She was deemed stable for discharge to home.    Discharge Physical Exam:  BP 107/73 (BP Location: Left Arm)   Pulse 69   Temp 98.7 F (37.1 C) (Oral)   Resp 14   Ht 5\' 2"  (1.575 m)   Wt 79.4 kg   LMP 01/13/2022   SpO2 99%   Breastfeeding Unknown   BMI 32.01 kg/m   General: NAD CV: RRR Pulm: CTABL, nl effort ABD: s/nd/nt, fundus firm and below the umbilicus Lochia: moderate Perineum:minimal edema/intact DVT Evaluation: LE non-ttp, no evidence of DVT on exam.  Hemoglobin  Date Value Ref Range Status  10/29/2022 10.4 (L) 12.0 - 15.0 g/dL Final  10/31/2022 62/56/3893 11.1 - 15.9 g/dL Final   HCT  Date Value Ref Range Status  10/29/2022 31.6 (L) 36.0 - 46.0 % Final   Hematocrit  Date Value Ref Range Status  08/14/2022 34.2 34.0 - 46.6 % Final    Risk assessment for postpartum VTE and prophylactic treatment: Very high risk factors: None High risk factors: None Moderate risk factors: BMI 30-40 kg/m2  Postpartum VTE prophylaxis with LMWH not indicated  Disposition: stable, discharge to home. Baby Feeding: breast feeding Baby Disposition: home with mom  Rh Immune globulin indicated: No Rubella vaccine given: was not indicated Varivax vaccine given: status unknown Flu vaccine given in AP setting: status unknown Tdap vaccine given in  AP setting: status unknown  Contraception:  BTL done during admission  Prenatal Labs:  Blood type/Rh O pos  Antibody screen neg  Rubella Immune  Varicella unknown  RPR NR  HBsAg Neg  HIV NR  GC neg  Chlamydia neg  Genetic screening negative  2 hour GTT 90, 163, 122  3 hour GTT N/a  GBS negative    Plan:  CHARLISHA MARKET was discharged to home in good condition. Follow-up appointment with delivering provider in 6 weeks.  Discharge Medications: Allergies as of 10/29/2022   No Known Allergies      Medication List     STOP taking these medications    aspirin EC 81 MG tablet       TAKE these medications    acetaminophen 325 MG tablet Commonly known as: Tylenol Take 2 tablets (650 mg total) by mouth every 6 (six) hours as needed (for pain scale < 4).   ibuprofen 600 MG tablet Commonly known as: ADVIL Take 1 tablet (600 mg total) by mouth every 6 (six) hours as needed.   oxyCODONE 5 MG immediate release tablet Commonly known as: Oxy IR/ROXICODONE Take 1 tablet (5 mg total) by mouth every 6 (six) hours as needed for up to 5 days for severe pain or breakthrough pain.   PRENATAL VITAMIN PO Take 1 tablet by mouth daily.   TUMS PO Take by mouth.         Follow-up Information     [facility]. Schedule an appointment as soon as possible for a visit in 6 week(s).   Contact information: [address] [place] [address] [phone]        [facility] Follow up.   Why: Call with any questions or concerns Contact information: [address] [place] [address] [phone]                Signed: Cyril Mourning  10/29/2022 6:57 PM

## 2022-10-29 NOTE — Progress Notes (Signed)
Obstetric Postpartum Daily Progress Note  Chief Complaint: Postpartum care after vaginal delivery  Subjective:  31 y.o. [id number] postpartum day #1 status post vaginal delivery.  She is ambulating, is tolerating po (though NPO), is voiding spontaneously.  Her pain is well controlled on PO pain medications. Her lochia is less than menses.   Medications SCHEDULED MEDICATIONS   ibuprofen  600 mg Oral Q6H   prenatal multivitamin  1 tablet Oral Q1200   senna-docusate  2 tablet Oral Daily   Tdap  0.5 mL Intramuscular Once    MEDICATION INFUSIONS   oxytocin Stopped (10/28/22 1010)    PRN MEDICATIONS  0.9 % irrigation (POUR BTL), acetaminophen, benzocaine-Menthol, coconut oil, witch hazel-glycerin **AND** dibucaine, diphenhydrAMINE, ondansetron **OR** ondansetron (ZOFRAN) IV, simethicone, zolpidem    Objective:   Vitals:   10/28/22 1701 10/28/22 2000 10/28/22 2311 10/29/22 0607  BP: 113/67 116/68 99/64 115/76  Pulse: 93 97 86 96  Resp: 14 18 16 20   Temp: 98.6 F (37 C) 98.7 F (37.1 C) 98.9 F (37.2 C) 97.9 F (36.6 C)  TempSrc: Oral Oral Oral Oral  SpO2: 97% 100% 100% 100%  Weight:      Height:        Current Vital Signs 24h Vital Sign Ranges  T 97.9 F (36.6 C) Temp  Avg: 98.3 F (36.8 C)  Min: 97.8 F (36.6 C)  Max: 98.9 F (37.2 C)  BP 115/76 BP  Min: 99/64  Max: 121/76  HR 96 Pulse  Avg: 88.6  Min: 79  Max: 100  RR 20 Resp  Avg: 16.3  Min: 14  Max: 20  SaO2 100 % (R/A)  (Room Air) SpO2  Avg: 99 %  Min: 97 %  Max: 100 %       24 Hour I/O Current Shift I/O  Time Ins Outs 12/23 0701 - 12/24 0700 In: -  Out: 1100 [Urine:1000] No intake/output data recorded.  General: NAD Pulmonary: no increased work of breathing Abdomen: non-distended, non-tender, fundus firm at level of umbilicus Extremities: no edema, no erythema, no tenderness  Labs:  Recent Labs  Lab 10/28/22 0901 10/29/22 0400  WBC 6.8 8.7  HGB 11.6* 10.4*  HCT 34.9* 31.6*  PLT 201 191      Assessment:   31 y.o. 10/31/22 postpartum day # 1 status post SVD before arriving at hospital  Plan:   1) Acute blood loss anemia - hemodynamically stable and asymptomatic - po ferrous sulfate  2) O POS /  Information for the patient's newborn:  [place], [place]  O POS  / Rubella 2.43 (07/10 1546)/ Varicella Unknown  31 y.o. 26  with undesired fertility, desires permanent sterilization.  Other reversible forms of contraception were discussed with patient; she declines all other modalities. Permanent nature of as well as associated risks of the procedure discussed with patient including but not limited to: risk of regret, permanence of method, bleeding, infection, injury to surrounding organs and need for additional procedures.  Failure risk of 0.5-1% with increased risk of ectopic gestation if pregnancy occurs was also discussed with patient.  She wishes to proceed and she has been NPO since last night.  To OR now.     [id number], MD 10/29/2022 8:08 AM

## 2022-10-30 ENCOUNTER — Encounter: Payer: Self-pay | Admitting: Obstetrics and Gynecology

## 2022-10-31 ENCOUNTER — Encounter: Payer: Self-pay | Admitting: Obstetrics and Gynecology

## 2022-11-01 LAB — SURGICAL PATHOLOGY

## 2022-11-02 ENCOUNTER — Encounter: Payer: Medicaid Other | Admitting: Advanced Practice Midwife

## 2022-11-02 ENCOUNTER — Encounter: Payer: Self-pay | Admitting: *Deleted

## 2022-11-08 ENCOUNTER — Encounter: Payer: Medicaid Other | Admitting: Family Medicine

## 2022-12-06 ENCOUNTER — Encounter: Payer: Self-pay | Admitting: Advanced Practice Midwife

## 2022-12-06 ENCOUNTER — Ambulatory Visit (INDEPENDENT_AMBULATORY_CARE_PROVIDER_SITE_OTHER): Payer: Medicaid Other | Admitting: Advanced Practice Midwife

## 2022-12-06 DIAGNOSIS — Z9851 Tubal ligation status: Secondary | ICD-10-CM | POA: Diagnosis not present

## 2022-12-06 NOTE — Progress Notes (Signed)
POSTPARTUM VISIT Patient name: Michele Bates MRN [id number]  Date of birth: 04/17/1991 Chief Complaint:   Postpartum Care  History of Present Illness:   Michele Bates is a 32 y.o. [phone] Hispanic female being seen today for a postpartum visit. She is  5.5  weeks postpartum following a spontaneous vaginal delivery at 38.3 gestational weeks, delivered at home and taken to [facility] w placenta delivery in ED. IOL: no, for n/a. Anesthesia: none.  Laceration: none.  Complications: (unplanned out of hospital delivery). Inpatient contraception: yes ppBTL on 10/29/22 .   Pregnancy complicated by hx of gHTN (none this preg); precipitous labor . Tobacco use: no. Substance use disorder: no. Last pap smear: Aug 2021 and results were NILM w/ HRHPV negative. Next pap smear due: Aug 2024 No LMP recorded.  Postpartum course has been uncomplicated. Bleeding none. Bowel function is  remarkable for some constipation- she is using stool softener/prunes . Bladder function is normal. Urinary incontinence? no, fecal incontinence? no Patient  (didn't ask about)  sexually active. Desired contraception: BTL done PP. Patient does not want a pregnancy in the future.  Desired family size is 4 children.   Upstream - 12/06/22 0350       Pregnancy Intention Screening   Does the patient want to become pregnant in the next year? No    Does the patient's partner want to become pregnant in the next year? No    Would the patient like to discuss contraceptive options today? No      Contraception Wrap Up   Current Method Female Sterilization    End Method Female Sterilization    Contraception Counseling Provided No            The pregnancy intention screening data noted above was reviewed. Potential methods of contraception were discussed. The patient elected to proceed with Female Sterilization.  Edinburgh Postpartum Depression Screening: negative  Edinburgh Postnatal Depression Scale - 12/06/22 0908        Edinburgh Postnatal Depression Scale:  In the Past 7 Days   I have been able to laugh and see the funny side of things. 0    I have looked forward with enjoyment to things. 0    I have blamed myself unnecessarily when things went wrong. 0    I have been anxious or worried for no good reason. 0    I have felt scared or panicky for no good reason. 0    Things have been getting on top of me. 0    I have been so unhappy that I have had difficulty sleeping. 0    I have felt sad or miserable. 0    I have been so unhappy that I have been crying. 0    The thought of harming myself has occurred to me. 0    Edinburgh Postnatal Depression Scale Total 0                10/19/2022   10:43 AM 08/14/2022    9:39 AM 05/15/2022    2:47 PM 10/15/2020   10:00 AM  GAD 7 : Generalized Anxiety Score  Nervous, Anxious, on Edge 0 0 0 0  Control/stop worrying 0 0 0 0  Worry too much - different things 0 0 0 0  Trouble relaxing 0 0 0 0  Restless 0 0 0 0  Easily annoyed or irritable 0 0 0 0  Afraid - awful might happen 0 0 0 0  Total GAD 7 Score  0 0 0 0     Baby's course has been complicated by her ped wanting to see a little more weight gain; started supplementing a little this week . Baby is feeding by breast and bottle: milk supply adequate. Infant has a pediatrician/family doctor? Yes.  Childcare strategy if returning to work/school: n/a-stay at home mom.  Pt has material needs met for her and baby: Yes.   Review of Systems:   Pertinent items are noted in HPI Denies Abnormal vaginal discharge w/ itching/odor/irritation, headaches, visual changes, shortness of breath, chest pain, abdominal pain, severe nausea/vomiting, or problems with urination or bowel movements. Pertinent History Reviewed:  Reviewed past medical,surgical, obstetrical and family history.  Reviewed problem list, medications and allergies. OB History  Gravida Para Term Preterm AB Living  5 4 4   1 4   SAB IAB Ectopic Multiple Live  Births  1     0 4    # Outcome Date GA Lbr Len/2nd Weight Sex Delivery Anes PTL Lv  5 Term 10/28/22 [redacted]w[redacted]d  7 lb 9 oz (3.43 kg) F Vag-Spont None  LIV     Birth Comments: Infant delivered at home, APGARs unknown  4 Term 12/25/20 [redacted]w[redacted]d 00:02 / 00:12 7 lb 15.2 oz (3.606 kg) M Vag-Spont None  LIV  3 Term 03/23/19 [redacted]w[redacted]d 02:45 / 00:21 7 lb 4.1 oz (3.29 kg) F Vag-Spont EPI  LIV     Complications: Gestational hypertension  2 SAB 10/22/17 [redacted]w[redacted]d         1 Term 08/18/11 [redacted]w[redacted]d  7 lb 5 oz (3.317 kg) F Vag-Spont EPI N LIV   Physical Assessment:   Vitals:   12/06/22 0908  BP: 109/80  Pulse: 80  Weight: 162 lb (73.5 kg)  Body mass index is 29.63 kg/m.       Physical Examination:   General appearance: alert, well appearing, and in no distress  Mental status: alert, oriented to person, place, and time  Skin: warm & dry   Cardiovascular: normal heart rate noted   Respiratory: normal respiratory effort, no distress   Breasts: deferred, no complaints   Abdomen: soft, non-tender   Pelvic: examination not indicated. Thin prep pap obtained: No  Rectal: not examined  Extremities: Edema: none         No results found for this or any previous visit (from the past 24 hour(s)).  Assessment & Plan:  1) Postpartum exam 2) 5.5 wks s/p spontaneous vaginal delivery 3) breast & bottle feeding 4) Depression screening 5) Contraception: s/p ppBTL  Essential components of care per ACOG recommendations:  1.  Mood and well being:  If positive depression screen, discussed and plan developed.  If using tobacco we discussed reduction/cessation and risk of relapse If current substance abuse, we discussed and referral to local resources was offered.   2. Infant care and feeding:  If breastfeeding, discussed returning to work, pumping, breastfeeding-associated pain, guidance regarding return to fertility while lactating if not using another method. If needed, patient was provided with a letter to be allowed to  pump q 2-3hrs to support lactation in a private location with access to a refrigerator to store breastmilk.   Recommended that all caregivers be immunized for flu, pertussis and other preventable communicable diseases If pt does not have material needs met for her/baby, referred to local resources for help obtaining these.  3. Sexuality, contraception and birth spacing Provided guidance regarding sexuality, management of dyspareunia, and resumption of intercourse Discussed avoiding interpregnancy interval <45mths and  recommended birth spacing of 18 months  4. Sleep and fatigue Discussed coping options for fatigue and sleep disruption Encouraged family/partner/community support of 4 hrs of uninterrupted sleep to help with mood and fatigue  5. Physical recovery  If pt had a C/S, assessed incisional pain and providing guidance on normal vs prolonged recovery If pt had a laceration, perineal healing and pain reviewed.  If urinary or fecal incontinence, discussed management and referred to PT or uro/gyn if indicated  Patient is safe to resume physical activity. Discussed attainment of healthy weight.  6.  Chronic disease management Discussed pregnancy complications if any, and their implications for future childbearing and long-term maternal health. Review recommendations for prevention of recurrent pregnancy complications, such as 17 hydroxyprogesterone caproate to reduce risk for recurrent PTB not applicable, or aspirin to reduce risk of preeclampsia not applicable. Pt had GDM: no. If yes, 2hr GTT scheduled: not applicable. Reviewed medications and non-pregnant dosing including consideration of whether pt is breastfeeding using a reliable resource such as LactMed: yes Referred for f/u w/ PCP or subspecialist providers as indicated: not applicable  7. Health maintenance Mammogram at 31yo or earlier if indicated Pap smears as indicated  Meds: No orders of the defined types were placed in this  encounter.   Follow-up: Return for Aug 2024 pap/physical.   No orders of the defined types were placed in this encounter.   Myrtis Ser CNM 12/06/2022 11:14 AM

## 2022-12-06 NOTE — Patient Instructions (Signed)
Use the resource www.postpartum.net for postpartum depression concerns or other needs.

## 2023-05-29 DIAGNOSIS — Z0120 Encounter for dental examination and cleaning without abnormal findings: Secondary | ICD-10-CM | POA: Diagnosis not present
